# Patient Record
Sex: Male | Born: 1960 | Race: Black or African American | Hispanic: No | Marital: Married | Smoking: Former smoker
Health system: Southern US, Community
[De-identification: ages and names within clinical notes are randomized; demographics above are authoritative.]

## PROBLEM LIST (undated history)

## (undated) DIAGNOSIS — I10 Essential (primary) hypertension: Secondary | ICD-10-CM

## (undated) HISTORY — PX: SURGERY SCROTAL / TESTICULAR: SUR1316

## (undated) HISTORY — DX: Essential (primary) hypertension: I10

## (undated) HISTORY — PX: OTHER SURGICAL HISTORY: SHX169

---

## 2013-11-02 ENCOUNTER — Emergency Department: Payer: Self-pay | Admitting: Emergency Medicine

## 2013-11-02 LAB — COMPREHENSIVE METABOLIC PANEL
ANION GAP: 6 — AB (ref 7–16)
AST: 33 U/L (ref 15–37)
Albumin: 3.4 g/dL (ref 3.4–5.0)
Alkaline Phosphatase: 77 U/L
BILIRUBIN TOTAL: 0.5 mg/dL (ref 0.2–1.0)
BUN: 14 mg/dL (ref 7–18)
CALCIUM: 8.5 mg/dL (ref 8.5–10.1)
CO2: 26 mmol/L (ref 21–32)
Chloride: 111 mmol/L — ABNORMAL HIGH (ref 98–107)
Creatinine: 1.05 mg/dL (ref 0.60–1.30)
Glucose: 98 mg/dL (ref 65–99)
Osmolality: 285 (ref 275–301)
Potassium: 4 mmol/L (ref 3.5–5.1)
SGPT (ALT): 26 U/L
SODIUM: 143 mmol/L (ref 136–145)
TOTAL PROTEIN: 7.1 g/dL (ref 6.4–8.2)

## 2013-11-02 LAB — CBC WITH DIFFERENTIAL/PLATELET
Basophil #: 0.1 10*3/uL (ref 0.0–0.1)
Basophil %: 1.1 %
Eosinophil #: 0.1 10*3/uL (ref 0.0–0.7)
Eosinophil %: 2.4 %
HCT: 39.3 % — ABNORMAL LOW (ref 40.0–52.0)
HGB: 12.7 g/dL — ABNORMAL LOW (ref 13.0–18.0)
LYMPHS ABS: 2.2 10*3/uL (ref 1.0–3.6)
Lymphocyte %: 47.3 %
MCH: 30.4 pg (ref 26.0–34.0)
MCHC: 32.3 g/dL (ref 32.0–36.0)
MCV: 94 fL (ref 80–100)
MONO ABS: 0.4 x10 3/mm (ref 0.2–1.0)
Monocyte %: 9.6 %
Neutrophil #: 1.8 10*3/uL (ref 1.4–6.5)
Neutrophil %: 39.6 %
PLATELETS: 184 10*3/uL (ref 150–440)
RBC: 4.18 10*6/uL — AB (ref 4.40–5.90)
RDW: 12.6 % (ref 11.5–14.5)
WBC: 4.6 10*3/uL (ref 3.8–10.6)

## 2013-11-02 LAB — TROPONIN I

## 2015-03-16 ENCOUNTER — Encounter: Payer: Self-pay | Admitting: Primary Care

## 2015-03-16 ENCOUNTER — Ambulatory Visit (INDEPENDENT_AMBULATORY_CARE_PROVIDER_SITE_OTHER): Payer: BLUE CROSS/BLUE SHIELD | Admitting: Primary Care

## 2015-03-16 VITALS — BP 152/112 | HR 70 | Temp 97.5°F | Ht 73.0 in | Wt 254.8 lb

## 2015-03-16 DIAGNOSIS — I10 Essential (primary) hypertension: Secondary | ICD-10-CM | POA: Insufficient documentation

## 2015-03-16 MED ORDER — AMLODIPINE BESYLATE 10 MG PO TABS
10.0000 mg | ORAL_TABLET | Freq: Every day | ORAL | Status: DC
Start: 2015-03-16 — End: 2015-08-26

## 2015-03-16 NOTE — Assessment & Plan Note (Signed)
Diagnosed 12 years ago, out of his benicar for 4-5 months. Denies chest pain, headaches, dizziness. Will start Amlodipine 10 mg today. Follow up in 2 weeks for re-evaluation.

## 2015-03-16 NOTE — Progress Notes (Signed)
Pre visit review using our clinic review tool, if applicable. No additional management support is needed unless otherwise documented below in the visit note. 

## 2015-03-16 NOTE — Progress Notes (Signed)
   Subjective:    Patient ID: Wesley Ward, male    DOB: 1960-09-28, 55 y.o.   MRN: [id number]  HPI  Wesley Ward is a 55 year old male who presents today to establish care and discuss the problems mentioned below. Will review old records. His last physical was 5 months ago at a place in [place], but never completed blood work.   1) Essential Hypertension: Diagnosed 12 years ago. Previously managed on Benicar 20/12.5 mg but has not taken in 4-5 months. Denies chest pain, headaches, dizziness. He endorses a fair diet most of the time. FH of hypertension.   Review of Systems  Constitutional: Negative for unexpected weight change.  HENT: Negative for rhinorrhea.   Respiratory: Negative for cough and shortness of breath.   Cardiovascular: Negative for chest pain.  Gastrointestinal: Negative for diarrhea and constipation.  Genitourinary:       Last prostate exam was 1 year ago. Slower stream overall for the past 6 months.  Musculoskeletal: Negative for myalgias and arthralgias.  Skin: Negative for rash.  Allergic/Immunologic: Positive for environmental allergies.  Neurological: Negative for dizziness, numbness and headaches.  Psychiatric/Behavioral:       Denies concerns for anxiety or depression       Past Medical History  Diagnosis Date  . Essential hypertension     Social History   Social History  . Marital Status: Unknown    Spouse Name: N/A  . Number of Children: N/A  . Years of Education: N/A   Occupational History  . Not on file.   Social History Main Topics  . Smoking status: Never Smoker   . Smokeless tobacco: Not on file  . Alcohol Use: No  . Drug Use: Not on file  . Sexual Activity: Not on file   Other Topics Concern  . Not on file   Social History Narrative   Married.   4 children.   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   Enjoys doing comedy.     Past Surgical History  Procedure Laterality Date  . Other surgical history     testiuclar surgery    Family History  Problem Relation Age of Onset  . Cancer Father     Unsure  . Ovarian cancer Mother   . Congestive Heart Failure Brother   . Hypertension Brother     Allergies  Allergen Reactions  . Ace Inhibitors Swelling    No current outpatient prescriptions on file prior to visit.   No current facility-administered medications on file prior to visit.    BP 152/112 mmHg  Pulse 70  Temp(Src) 97.5 F (36.4 C) (Oral)  Ht 6\' 1"  (1.854 m)  Wt 254 lb 12.8 oz (115.577 kg)  BMI 33.62 kg/m2  SpO2 95%    Objective:   Physical Exam  Constitutional: He is oriented to person, place, and time. He appears well-nourished.  Cardiovascular: Normal rate and regular rhythm.   Pulmonary/Chest: Effort normal and breath sounds normal.  Neurological: He is alert and oriented to person, place, and time.  Skin: Skin is warm and dry.  Psychiatric: He has a normal mood and affect.          Assessment & Plan:

## 2015-03-16 NOTE — Patient Instructions (Signed)
Start Amlodipine tablets for high blood pressure. Take 1 tablet by mouth once daily.  Please schedule a physical with me within the next 3 months. You may also schedule a lab only appointment 3-4 days prior. We will discuss your lab results in detail during your physical.  Follow up in 2 weeks for re-evaluation of blood pressure.  It was a pleasure to meet you today! Please don't hesitate to call me with any questions. Welcome to [facility]!  Hypertension Hypertension, commonly called high blood pressure, is when the force of blood pumping through your arteries is too strong. Your arteries are the blood vessels that carry blood from your heart throughout your body. A blood pressure reading consists of a higher number over a lower number, such as 110/72. The higher number (systolic) is the pressure inside your arteries when your heart pumps. The lower number (diastolic) is the pressure inside your arteries when your heart relaxes. Ideally you want your blood pressure below 120/80. Hypertension forces your heart to work harder to pump blood. Your arteries may become narrow or stiff. Having untreated or uncontrolled hypertension can cause heart attack, stroke, kidney disease, and other problems. RISK FACTORS Some risk factors for high blood pressure are controllable. Others are not.  Risk factors you cannot control include:   Race. You may be at higher risk if you are African American.  Age. Risk increases with age.  Gender. Men are at higher risk than women before age 36 years. After age 70, women are at higher risk than men. Risk factors you can control include:  Not getting enough exercise or physical activity.  Being overweight.  Getting too much fat, sugar, calories, or salt in your diet.  Drinking too much alcohol. SIGNS AND SYMPTOMS Hypertension does not usually cause signs or symptoms. Extremely high blood pressure (hypertensive crisis) may cause headache, anxiety, shortness of  breath, and nosebleed. DIAGNOSIS To check if you have hypertension, your health care provider will measure your blood pressure while you are seated, with your arm held at the level of your heart. It should be measured at least twice using the same arm. Certain conditions can cause a difference in blood pressure between your right and left arms. A blood pressure reading that is higher than normal on one occasion does not mean that you need treatment. If it is not clear whether you have high blood pressure, you may be asked to return on a different day to have your blood pressure checked again. Or, you may be asked to monitor your blood pressure at home for 1 or more weeks. TREATMENT Treating high blood pressure includes making lifestyle changes and possibly taking medicine. Living a healthy lifestyle can help lower high blood pressure. You may need to change some of your habits. Lifestyle changes may include:  Following the DASH diet. This diet is high in fruits, vegetables, and whole grains. It is low in salt, red meat, and added sugars.  Keep your sodium intake below 2,300 mg per day.  Getting at least 30-45 minutes of aerobic exercise at least 4 times per week.  Losing weight if necessary.  Not smoking.  Limiting alcoholic beverages.  Learning ways to reduce stress. Your health care provider may prescribe medicine if lifestyle changes are not enough to get your blood pressure under control, and if one of the following is true:  You are 2-15 years of age and your systolic blood pressure is above 140.  You are 68 years of age or  older, and your systolic blood pressure is above 150.  Your diastolic blood pressure is above 90.  You have diabetes, and your systolic blood pressure is over 140 or your diastolic blood pressure is over 90.  You have kidney disease and your blood pressure is above 140/90.  You have heart disease and your blood pressure is above 140/90. Your personal target  blood pressure may vary depending on your medical conditions, your age, and other factors. HOME CARE INSTRUCTIONS  Have your blood pressure rechecked as directed by your health care provider.   Take medicines only as directed by your health care provider. Follow the directions carefully. Blood pressure medicines must be taken as prescribed. The medicine does not work as well when you skip doses. Skipping doses also puts you at risk for problems.  Do not smoke.   Monitor your blood pressure at home as directed by your health care provider. SEEK MEDICAL CARE IF:   You think you are having a reaction to medicines taken.  You have recurrent headaches or feel dizzy.  You have swelling in your ankles.  You have trouble with your vision. SEEK IMMEDIATE MEDICAL CARE IF:  You develop a severe headache or confusion.  You have unusual weakness, numbness, or feel faint.  You have severe chest or abdominal pain.  You vomit repeatedly.  You have trouble breathing. MAKE SURE YOU:   Understand these instructions.  Will watch your condition.  Will get help right away if you are not doing well or get worse.   This information is not intended to replace advice given to you by your health care provider. Make sure you discuss any questions you have with your health care provider.   Document Released: 02/27/2005 Document Revised: 07/14/2014 Document Reviewed: 12/20/2012 Elsevier Interactive Patient Education [employer]2016 Elsevier Inc.

## 2015-04-09 ENCOUNTER — Encounter: Payer: Self-pay | Admitting: Primary Care

## 2015-04-09 ENCOUNTER — Ambulatory Visit (INDEPENDENT_AMBULATORY_CARE_PROVIDER_SITE_OTHER): Payer: BLUE CROSS/BLUE SHIELD | Admitting: Primary Care

## 2015-04-09 VITALS — BP 160/92 | HR 76 | Temp 97.6°F | Ht 73.0 in | Wt 244.2 lb

## 2015-04-09 DIAGNOSIS — I10 Essential (primary) hypertension: Secondary | ICD-10-CM

## 2015-04-09 NOTE — Assessment & Plan Note (Signed)
Diastolic improved, overall remains above goal. Will have him record home readings and send them to me in 2 weeks. If no improvement with his efforts of weight loss, then will add second medication.

## 2015-04-09 NOTE — Patient Instructions (Signed)
Check your blood pressure on your days off, around the same time of day, for the next 2 weeks.  Ensure that you have rested for 30 minutes prior to checking your blood pressure. Record your readings and bring them to your next visit.  Please schedule a physical with me in 2 weeks. We will do same day labs. Make sure you come fasting for 4 hours. Water and black coffee only.  It was a pleasure to see you today!

## 2015-04-09 NOTE — Progress Notes (Signed)
Pre visit review using our clinic review tool, if applicable. No additional management support is needed unless otherwise documented below in the visit note. 

## 2015-04-09 NOTE — Progress Notes (Signed)
   Subjective:    Patient ID: Wesley Ward, male    DOB: 17-Nov-1960, 55 y.o.   MRN: [id number]  HPI  Mr. Cupples is a 55 year old male who presents today for follow up of hypertension. He was evaluated on 03/16/15 as a new patient and noted to be hypertensive. History of HTN for 12 years and was out of his benicar for several months. He was initiated on Amlodipine 10 mg last visit.   Since his last visit he's lost 10 pounds. He's been working to improve his diet and has been eating more salads. He's not checking his blood pressure as discussed. He did have some fatigue and decrease in appetite for 1 week after starting his medication, but denies those symptoms since 1 week ago. He currently denies chest pain, shortness of breath, dizziness, headaches.    Wt Readings from Last 3 Encounters:  04/09/15 244 lb 3.2 oz (110.768 kg)  03/16/15 254 lb 12.8 oz (115.577 kg)    BP Readings from Last 3 Encounters:  04/09/15 160/92  03/16/15 152/112      Review of Systems  Respiratory: Negative for shortness of breath.   Cardiovascular: Negative for chest pain and leg swelling.  Neurological: Negative for dizziness and headaches.       Past Medical History  Diagnosis Date  . Essential hypertension     Social History   Social History  . Marital Status: Unknown    Spouse Name: N/A  . Number of Children: N/A  . Years of Education: N/A   Occupational History  . Not on file.   Social History Main Topics  . Smoking status: Never Smoker   . Smokeless tobacco: Not on file  . Alcohol Use: No  . Drug Use: Not on file  . Sexual Activity: Not on file   Other Topics Concern  . Not on file   Social History Narrative   Married.   4 children.   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   Enjoys doing comedy.     Past Surgical History  Procedure Laterality Date  . Other surgical history      testiuclar surgery    Family History  Problem Relation Age of Onset    . Cancer Father     Unsure  . Ovarian cancer Mother   . Congestive Heart Failure Brother   . Hypertension Brother     Allergies  Allergen Reactions  . Ace Inhibitors Swelling    Current Outpatient Prescriptions on File Prior to Visit  Medication Sig Dispense Refill  . amLODipine (NORVASC) 10 MG tablet Take 1 tablet (10 mg total) by mouth daily. 30 tablet 3   No current facility-administered medications on file prior to visit.    BP 160/92 mmHg  Pulse 76  Temp(Src) 97.6 F (36.4 C) (Oral)  Ht  (1.854 m)  Wt 244 lb 3.2 oz (110.768 kg)  BMI 32.23 kg/m2  SpO2 95%    Objective:   Physical Exam  Constitutional: He appears well-nourished.  Cardiovascular: Normal rate and regular rhythm.   Pulmonary/Chest: Effort normal and breath sounds normal.  Skin: Skin KALEB SEKy.          Assessment & Plan:

## 2015-04-23 ENCOUNTER — Telehealth: Payer: Self-pay | Admitting: Primary Care

## 2015-04-23 NOTE — Telephone Encounter (Signed)
Will you call Wesley Ward to check on his BP readings. He was supposed to check his BP for the previous 2 weeks. Thanks.

## 2015-04-23 NOTE — Telephone Encounter (Signed)
Message left for patient to return my call.  

## 2015-04-26 ENCOUNTER — Telehealth: Payer: Self-pay | Admitting: Primary Care

## 2015-04-26 DIAGNOSIS — I10 Essential (primary) hypertension: Secondary | ICD-10-CM

## 2015-04-26 MED ORDER — HYDROCHLOROTHIAZIDE 25 MG PO TABS: 25.0000 mg | ORAL_TABLET | Freq: Every day | ORAL | Status: DC

## 2015-04-26 NOTE — Telephone Encounter (Signed)
Called and notified patient's spouse of Kate's comments. Patient's spouse verbalized understanding.  

## 2015-04-26 NOTE — Telephone Encounter (Signed)
Called and spoken to patient. He stated that he is doing ok. Blood Pressure running in the 160-170 over 90-80.

## 2015-04-26 NOTE — Telephone Encounter (Signed)
Please notify Wesley Ward that his blood pressure is still too high. We will need to add another BP medication to his regimen. I've sent in a prescription for Hydrochlorothiazide 25 mg to his pharmacy. He will take 1 tablet by mouth every day. Please have him to continue to check his BP daily, we will call back in 2 weeks for updated readings.  I need to see him back in the office in 1 month. Will you please schedule?

## 2015-05-14 ENCOUNTER — Ambulatory Visit: Payer: BLUE CROSS/BLUE SHIELD | Admitting: Primary Care

## 2015-05-28 ENCOUNTER — Encounter: Payer: Self-pay | Admitting: Primary Care

## 2015-05-28 ENCOUNTER — Ambulatory Visit (INDEPENDENT_AMBULATORY_CARE_PROVIDER_SITE_OTHER): Payer: BLUE CROSS/BLUE SHIELD | Admitting: Primary Care

## 2015-05-28 VITALS — BP 152/92 | HR 75 | Temp 97.5°F | Ht 73.0 in | Wt 237.0 lb

## 2015-05-28 DIAGNOSIS — R39198 Other difficulties with micturition: Secondary | ICD-10-CM | POA: Diagnosis not present

## 2015-05-28 DIAGNOSIS — I10 Essential (primary) hypertension: Secondary | ICD-10-CM

## 2015-05-28 MED ORDER — ATENOLOL 50 MG PO TABS: 50.0000 mg | ORAL_TABLET | Freq: Every day | ORAL | Status: DC

## 2015-05-28 MED ORDER — TAMSULOSIN HCL 0.4 MG PO CAPS: 0.4000 mg | ORAL_CAPSULE | Freq: Every day | ORAL | Status: DC

## 2015-05-28 NOTE — Progress Notes (Signed)
Subjective:    Patient ID: Wesley Ward, male    DOB: 1960-08-18, 55 y.o.   MRN: [id number]  HPI  Mr. Wesley Ward is a [age] year old male who presents today for follow up of hypertension. He was evaluated on 03/16/15 as a new patient and noted to be hypertensive. He was initiated on Amlodipine 10 mg during that visit. He was re-evaluated in late January and noted to have an improvement in blood pressure, but still over goal. He was encouraged to send home readings 2 weeks later. Home readings were 160's/90's 2 weeks later and was initiated on HCTZ 25 mg.  Since the addition of the HCTZ he's only been taking that medication as he was confused. He has not had his amlodipine in 1 month. His BP is above goal in the clinic today. Denies chest pain, headaches, dizziness, changes in vision. He has noticed increased cramping to his muscles over the past 2-3 weeks. He will get a cramp with any stretching or movement.   He's also working to improve his diet by making healthier choices (eating more salads, vegetables, less fried foods). He is not exercising.  Wt Readings from Last 3 Encounters:  05/28/15 237 lb (107.502 kg)  04/09/15 244 lb 3.2 oz (110.768 kg)  03/16/15 254 lb 12.8 oz (115.577 kg)    2) Difficulty Urinating: Present for the past 2 months. He will have to force his urine out. He feels a tightness/contraction to his bladder that prevents him from urinating. Denies nocturia, fevers, chills, dysuria. He's been drinking more water, less sugary drinks.   Review of Systems  Respiratory: Negative for shortness of breath.   Cardiovascular: Negative for chest pain.  Genitourinary: Positive for difficulty urinating. Negative for frequency, discharge and penile pain.  Musculoskeletal: Positive for myalgias.  Neurological: Negative for dizziness and headaches.       Past Medical History  Diagnosis Date  . Essential hypertension     Social History   Social History  . Marital Status:  Unknown    Spouse Name: N/A  . Number of Children: N/A  . Years of Education: N/A   Occupational History  . Not on file.   Social History Main Topics  . Smoking status: Never Smoker   . Smokeless tobacco: Not on file  . Alcohol Use: No  . Drug Use: Not on file  . Sexual Activity: Not on file   Other Topics Concern  . Not on file   Social History Narrative   Married.   4 children.   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   Enjoys doing comedy.     Past Surgical History  Procedure Laterality Date  . Other surgical history      testiuclar surgery    Family History  Problem Relation Age of Onset  . Cancer Father     Unsure  . Ovarian cancer Mother   . Congestive Heart Failure Brother   . Hypertension Brother     Allergies  Allergen Reactions  . Ace Inhibitors Swelling    Current Outpatient Prescriptions on File Prior to Visit  Medication Sig Dispense Refill  . amLODipine (NORVASC) 10 MG tablet Take 1 tablet (10 mg total) by mouth daily. 30 tablet 3   No current facility-administered medications on file prior to visit.    BP 152/92 mmHg  Pulse 75  Temp(Src) 97.5 F (36.4 C) (Oral)  Ht 6\' 1"  (1.854 m)  Wt 237 lb (107.502  kg)  BMI 31.27 kg/m2  SpO2 95%    Objective:   Physical Exam  Constitutional: He appears well-nourished.  Cardiovascular: Normal rate and regular rhythm.   Pulmonary/Chest: Effort normal and breath sounds normal.  Skin: Skin is warm and dry.          Assessment & Plan:

## 2015-05-28 NOTE — Assessment & Plan Note (Signed)
Above goal in clinic today. He's only taken the HCTZ as he thought he was to stop the Amlodipine. Also with wide spread muscle cramps since starting HCTZ. Will stop HCTZ and switch to Atenolol 50 mg. Allergy to ACE. BMP pending.  He is to send home readings in 2 weeks. Follow up in 1 month.

## 2015-05-28 NOTE — Addendum Note (Signed)
Addended by: Doreene NestLARK, Lauren Modisette K on: 05/28/2015 05:54 PM   Modules accepted: Kipp BroodSmartSet

## 2015-05-28 NOTE — Progress Notes (Signed)
Pre visit review using our clinic review tool, if applicable. No additional management support is needed unless otherwise documented below in the visit note. 

## 2015-05-28 NOTE — Assessment & Plan Note (Signed)
Present for the past 2-3 months. Sounds to be bladder neck spasm/tightness. Will check PSA today. No nocturia or fevers. Start trial of Flomax today. He is to update me in 2 weeks.

## 2015-05-28 NOTE — Patient Instructions (Addendum)
Start taking both the Atenolol 50 mg and Amlodipine 10 mg for blood pressure. Take 1 tablet of each by mouth every morning with food.  Stop taking Hydrochlorothiazide as it is likely causing muscle cramps.  You may start Tamsulosin (Flomax) tablets for difficulty urinating. Take 1 capsule by mouth once daily.  Check your blood pressure daily, around the same time of day, for the next 2 weeks.  Ensure that you have rested for 30 minutes prior to checking your blood pressure. Record your readings and call them into the office in 2 weeks.  Follow up as scheduled for re-evaluation of blood pressure.  It was a pleasure to see you today!

## 2015-05-29 LAB — BASIC METABOLIC PANEL
BUN: 14 mg/dL (ref 7–25)
CHLORIDE: 103 mmol/L (ref 98–110)
CO2: 26 mmol/L (ref 20–31)
CREATININE: 1.08 mg/dL (ref 0.70–1.33)
Calcium: 10 mg/dL (ref 8.6–10.3)
Glucose, Bld: 106 mg/dL — ABNORMAL HIGH (ref 65–99)
POTASSIUM: 4.1 mmol/L (ref 3.5–5.3)
Sodium: 138 mmol/L (ref 135–146)

## 2015-05-29 LAB — PSA: PSA: 1.26 ng/mL (ref <=4.00)

## 2015-06-02 ENCOUNTER — Encounter: Payer: Self-pay | Admitting: *Deleted

## 2015-06-03 ENCOUNTER — Encounter: Payer: Self-pay | Admitting: Emergency Medicine

## 2015-06-03 ENCOUNTER — Emergency Department
Admission: EM | Admit: 2015-06-03 | Discharge: 2015-06-03 | Disposition: A | Discharge status: Home / Self Care | Payer: BLUE CROSS/BLUE SHIELD | Attending: Emergency Medicine | Admitting: Emergency Medicine

## 2015-06-03 DIAGNOSIS — I10 Essential (primary) hypertension: Secondary | ICD-10-CM | POA: Insufficient documentation

## 2015-06-03 DIAGNOSIS — Z79899 Other long term (current) drug therapy: Secondary | ICD-10-CM | POA: Insufficient documentation

## 2015-06-03 LAB — CBC WITH DIFFERENTIAL/PLATELET
Basophils Absolute: 0.1 10*3/uL (ref 0–0.1)
Basophils Relative: 1 %
Eosinophils Absolute: 0.2 10*3/uL (ref 0–0.7)
Eosinophils Relative: 3 %
HCT: 37.2 % — ABNORMAL LOW (ref 40.0–52.0)
HEMOGLOBIN: 12.8 g/dL — AB (ref 13.0–18.0)
LYMPHS ABS: 3.4 10*3/uL (ref 1.0–3.6)
LYMPHS PCT: 45 %
MCH: 31 pg (ref 26.0–34.0)
MCHC: 34.4 g/dL (ref 32.0–36.0)
MCV: 90 fL (ref 80.0–100.0)
Monocytes Absolute: 0.6 10*3/uL (ref 0.2–1.0)
Monocytes Relative: 9 %
NEUTROS PCT: 42 %
Neutro Abs: 3.1 10*3/uL (ref 1.4–6.5)
Platelets: 224 10*3/uL (ref 150–440)
RBC: 4.14 MIL/uL — AB (ref 4.40–5.90)
RDW: 12.9 % (ref 11.5–14.5)
WBC: 7.4 10*3/uL (ref 3.8–10.6)

## 2015-06-03 LAB — BASIC METABOLIC PANEL
Anion gap: 6 (ref 5–15)
BUN: 13 mg/dL (ref 6–20)
CHLORIDE: 103 mmol/L (ref 101–111)
CO2: 27 mmol/L (ref 22–32)
Calcium: 9.2 mg/dL (ref 8.9–10.3)
Creatinine, Ser: 1.02 mg/dL (ref 0.61–1.24)
GFR calc Af Amer: >60 mL/min (ref >60)
GFR calc non Af Amer: >60 mL/min (ref >60)
GLUCOSE: 91 mg/dL (ref 65–99)
POTASSIUM: 3.6 mmol/L (ref 3.5–5.1)
Sodium: 136 mmol/L (ref 135–145)

## 2015-06-03 NOTE — ED Notes (Signed)
Patient had a recent change in his medications and was told to stop taking amlodipine and start taking atenolol. Patient states that this morning he took the amlodipine and tonight he took the atenolol around 20:00. Patient reports that on the ride here he became light headed.

## 2015-06-03 NOTE — ED Provider Notes (Signed)
[facility] Emergency Department Provider Note  ____________________________________________  Time seen: Approximately 10:08 PM  I have reviewed the triage vital signs and the nursing notes.   HISTORY  Chief Complaint Headache    HPI Wesley Ward is a 55 y.o. male with a history of hypertension who presents with concerns about his blood pressure.  His blood pressure medication regimen was recently changed around and he is confused about what is supposed be taking and what he is supposed to stop taking.  He showed me the instructions from his recent clinic visit which indicates he should be taking Norvasc and atenolol in the morning and he should discontinue his HCTZ, but he was confused because he states he was not previously taking HCTZ at all.  He came in because he was concerned his blood pressure was going to drop too low if he took the wrong medications.  He had a mild headache earlier but that has completely resolved.  He felt a little bit lightheaded earlier as well but that has also resolved he denies chest pain, shortness of breath, abdominal pain, nausea, vomiting, visual changes.  Overall he is essentially asymptomatic but mostly concerned about his blood pressure.   Past Medical History  Diagnosis Date  . Essential hypertension     Patient Active Problem List   Diagnosis Date Noted  . Difficulty urinating 05/28/2015  . Essential hypertension 03/16/2015    Past Surgical History  Procedure Laterality Date  . Other surgical history      testiuclar surgery    Current Outpatient Rx  Name  Route  Sig  Dispense  Refill  . amLODipine (NORVASC) 10 MG tablet   Oral   Take 1 tablet (10 mg total) by mouth daily.   30 tablet   3   . atenolol (TENORMIN) 50 MG tablet   Oral   Take 1 tablet (50 mg total) by mouth daily.   30 tablet   2   . tamsulosin (FLOMAX) 0.4 MG CAPS capsule   Oral   Take 1 capsule (0.4 mg total) by mouth daily.   30  capsule   0     Allergies Ace inhibitors  Family History  Problem Relation Age of Onset  . Cancer Father     Unsure  . Ovarian cancer Mother   . Congestive Heart Failure Brother   . Hypertension Brother     Social History Social History  Substance Use Topics  . Smoking status: Never Smoker   . Smokeless tobacco: None  . Alcohol Use: No    Review of Systems Constitutional: No fever/chills Eyes: No visual changes. ENT: No sore throat. Cardiovascular: Denies chest pain. Respiratory: Denies shortness of breath. Gastrointestinal: No abdominal pain.  No nausea, no vomiting.  No diarrhea.  No constipation. Genitourinary: Negative for dysuria. Musculoskeletal: Negative for back pain. Skin: Negative for rash. Neurological: Negative for headaches, focal weakness or numbness.  10-point ROS otherwise negative.  ____________________________________________   PHYSICAL EXAM:  VITAL SIGNS: ED Triage Vitals  Enc Vitals Group     BP 06/03/15 2052 172/106 mmHg     Pulse Rate 06/03/15 2052 64     Resp 06/03/15 2052 16     Temp 06/03/15 2052 97.8 F (36.6 C)     Temp Source 06/03/15 2052 Oral     SpO2 06/03/15 2052 94 %     Weight 06/03/15 2052 234 lb (106.142 kg)     Height 06/03/15 2052 6\' 2"  (1.88 m)  Head Cir --      Peak Flow --      Pain Score 06/03/15 2054 0     Pain Loc --      Pain Edu? --      Excl. in GC? --     Constitutional: Alert and oriented. Well appearing and in no acute distress. Eyes: Conjunctivae are normal. PERRL. Right sided strabismus. Head: Atraumatic. Nose: No congestion/rhinnorhea. Mouth/Throat: Mucous membranes are moist.  Oropharynx non-erythematous. Neck: No stridor.  No meningeal signs.   Cardiovascular: Normal rate, regular rhythm. Good peripheral circulation. Grossly normal heart sounds.   Respiratory: Normal respiratory effort.  No retractions. Lungs CTAB. Gastrointestinal: Soft and nontender. No distention.  Musculoskeletal: No  lower extremity tenderness nor edema. No gross deformities of extremities. Neurologic:  Normal speech and language. No gross focal neurologic deficits are appreciated.  Skin:  Skin is warm, dry and intact. No rash noted. Psychiatric: Mood and affect are normal. Speech and behavior are normal.  ____________________________________________   LABS (all labs ordered are listed, but only abnormal results are displayed)  Labs Reviewed  CBC WITH DIFFERENTIAL/PLATELET - Abnormal; Notable for the following:    RBC 4.14 (*)    Hemoglobin 12.8 (*)    HCT 37.2 (*)    All other components within normal limits  BASIC METABOLIC PANEL   ____________________________________________  EKG  None ____________________________________________  RADIOLOGY   No results found.  ____________________________________________   PROCEDURES  Procedure(s) performed: None  Critical Care performed: No ____________________________________________   INITIAL IMPRESSION / ASSESSMENT AND PLAN / ED COURSE  Pertinent labs & imaging results that were available during my care of the patient were reviewed by me and considered in my medical decision making (see chart for details).  I had my usual and customary benign hypertension discussion with the patient and encouraged him to continue taking his medication according to the most recent clinic visit notes.  His BMP is normal and there is no indication that he has any sort of an organ dysfunction.  Even though his blood pressure remained elevated in the emergency department he is on medication and recently was changed to a new regimen and he has the ability to follow closely as an outpatient.  There is no indication for further management in the emergency department at this time.  He and his wife understand and agree with this plan.  I gave my usual and customary return precautions.     ____________________________________________  FINAL CLINICAL IMPRESSION(S) /  ED DIAGNOSES  Final diagnoses:  Essential hypertension      NEW MEDICATIONS STARTED DURING THIS VISIT:  New Prescriptions   No medications on file      Note:  This document was prepared using Dragon voice recognition software and may include unintentional dictation errors.   Loleta Rose, MD 06/03/15 2229

## 2015-06-03 NOTE — ED Notes (Signed)
Unsure if taking blood pressure medication correctly.   Current medications have "different directions on them".  Patient seen by PCP on 3/17 and plan states to take amlodipine and atenolol daily and stop taking hydrochlorothiazide.  Patient c/o dull headache and occasional dizzines since Tuesday.  Today patient states he took amlodipine 10 mg this morning and this evening took 50 mg atenolol.

## 2015-06-03 NOTE — Discharge Instructions (Signed)

## 2015-06-04 ENCOUNTER — Ambulatory Visit: Payer: BLUE CROSS/BLUE SHIELD | Admitting: Primary Care

## 2015-06-04 ENCOUNTER — Telehealth: Payer: Self-pay

## 2015-06-04 NOTE — Telephone Encounter (Signed)
Pt is confused about his meds; pt said he has never taken HCTZ; pt states he did not know that pts wife spoke with anyone 04/2015 about starting HCTZ. On 06/03/15 pt took Atenolol 50 mg at 7:30 pm; on 06/04/15 at 4 AM pt took amlodipine 10 mg. Pt has not picked up tamsulosin yet because cost is $ 20.00. Pt has not taken BP today; usually takes at lunch break; 06/03/15 BP at lunch where pt had rested 30 mins prior to taking BP was 167/107. 06/03/15 at 10:15 pm BP was 148/96. Pt still had AVS from 05/28/15 visit and pt said he could read what it said but that was not what he was told at the visit. Pt request Mayra ReelKate Clark NP to review and contact pt.

## 2015-06-04 NOTE — Telephone Encounter (Signed)
Please have Wesley Ward take:  Atenolol 50 mg. Take 1 tablet by mouth every morning. This is for blood pressure. Amlodipine 10 mg. Take 1 tablet by mouth every morning. This is for blood pressure.  DO NOT TAKE: Hydrochlorothiazide 25 mg tablets as this caused muscle cramping.  All of this was written on his last AVS.

## 2015-06-04 NOTE — Telephone Encounter (Signed)
Patient advised and voiced understanding.  

## 2015-06-05 ENCOUNTER — Telehealth: Payer: Self-pay | Admitting: Primary Care

## 2015-06-05 NOTE — Telephone Encounter (Signed)
I see that Mr. Hissong presented to the ED for hypertension and confusion of medications. Will you please ensure he's aware of how to take his medications now? He should be taking Atenolol 50 mg once every day and Amlodipine 10 mg once every day. These medications are both for blood pressure.  It appears he was started on Amlodipine 10 mg in January 2017. We then added HCTZ 25 mg in February 2017 as his home readings were too high. Did he ever start this medication (HCTZ)?  He seemed to understand the plan during our last visit, so I'm just hoping things are cleared up now. Please also notify him to call us and ask to speak with me personally if he becomes confused again. Also please have him take note of home readings and call me in 2 weeks.  Thanks.

## 2015-06-07 NOTE — Telephone Encounter (Signed)
Noted. However he presented to the ED over the weekend with confusion regarding meds. Will continue to monitor.

## 2015-06-07 NOTE — Telephone Encounter (Signed)
There is a telephone note from 06/04/15 after ED visit where pt called in and you advised on how to take meds and to not take HCTZ--pt expressed understanding and will call back with home BP readings

## 2015-06-11 ENCOUNTER — Telehealth: Payer: Self-pay | Admitting: Primary Care

## 2015-06-11 NOTE — Telephone Encounter (Signed)
Called and notified patient of Kate's comments. Patient stated that he is taking his both his medication. Blood pressure readings have been around 176-150 over 94-92.

## 2015-06-11 NOTE — Telephone Encounter (Signed)
Noted. Hard to believe as it was this high last visit on one medication.  Will recheck in 1 week.

## 2015-06-11 NOTE — Telephone Encounter (Signed)
-----   Message from Doreene NestKatherine K Clark, NP sent at 06/05/2015  5:39 PM EDT ----- Regarding: BP Very confused on medication regimen.  Now on atenolol 50 mg and Amlodipine 10. Is he taking? How are BP readings?

## 2015-06-18 ENCOUNTER — Telehealth: Payer: Self-pay | Admitting: Primary Care

## 2015-06-18 NOTE — Telephone Encounter (Signed)
-----   Message from Doreene NestKatherine K Kahlel Peake, NP sent at 06/11/2015  5:19 PM EDT ----- Regarding: BP Will you please check on his blood pressure readings?

## 2015-06-18 NOTE — Telephone Encounter (Signed)
Message left for patient to return my call.  

## 2015-06-21 NOTE — Telephone Encounter (Signed)
Message left for patient to return my call.  

## 2015-07-02 ENCOUNTER — Encounter: Payer: Self-pay | Admitting: Primary Care

## 2015-07-02 ENCOUNTER — Ambulatory Visit (INDEPENDENT_AMBULATORY_CARE_PROVIDER_SITE_OTHER): Payer: BLUE CROSS/BLUE SHIELD | Admitting: Primary Care

## 2015-07-02 VITALS — BP 138/78 | HR 92 | Temp 97.8°F | Ht 74.0 in | Wt 239.4 lb

## 2015-07-02 DIAGNOSIS — Z0000 Encounter for general adult medical examination without abnormal findings: Secondary | ICD-10-CM | POA: Diagnosis not present

## 2015-07-02 DIAGNOSIS — Z1211 Encounter for screening for malignant neoplasm of colon: Secondary | ICD-10-CM | POA: Diagnosis not present

## 2015-07-02 DIAGNOSIS — I10 Essential (primary) hypertension: Secondary | ICD-10-CM

## 2015-07-02 DIAGNOSIS — R39198 Other difficulties with micturition: Secondary | ICD-10-CM

## 2015-07-02 NOTE — Patient Instructions (Signed)
You will be contacted regarding your referral to GI for your colonoscopy.  Please let us know if you have not heard back within one week.   Start exercising. You should be getting 1 hour of moderate intensity exercise 5 days weekly.  Work to [employer]. Ensure you are eating enough fresh fruits, vegetables, lean meats.  Follow up in 6 months for re-evaluation of your blood pressure.  It was a pleasure to see you today!  DASH Eating Plan DASH stands for "Dietary Approaches to Stop Hypertension." The DASH eating plan is a healthy eating plan that has been shown to reduce high blood pressure (hypertension). Additional health benefits may include reducing the risk of type 2 diabetes mellitus, heart disease, and stroke. The DASH eating plan may also help with weight loss. WHAT DO I NEED TO KNOW ABOUT THE DASH EATING PLAN? For the DASH eating plan, you will follow these general guidelines:  Choose foods with a percent daily value for sodium of less than 5% (as listed on the food label).  Use salt-free seasonings or herbs instead of table salt or sea salt.  Check with your health care provider or pharmacist before using salt substitutes.  Eat lower-sodium products, often labeled as "lower sodium" or "no salt added."  Eat fresh foods.  Eat more vegetables, fruits, and low-fat dairy products.  Choose whole grains. Look for the word "whole" as the first word in the ingredient list.  Choose fish and skinless chicken or [place] more often than red meat. Limit fish, poultry, and meat to 6 oz (170 g) each day.  Limit sweets, desserts, sugars, and sugary drinks.  Choose heart-healthy fats.  Limit cheese to 1 oz (28 g) per day.  Eat more home-cooked food and less restaurant, buffet, and fast food.  Limit fried foods.  Cook foods using methods other than frying.  Limit canned vegetables. If you do use them, rinse them well to decrease the sodium.  When eating at a restaurant, ask  that your food be prepared with less salt, or no salt if possible. WHAT FOODS CAN I EAT? Seek help from a dietitian for individual calorie needs. Grains Whole grain or whole wheat bread. Brown rice. Whole grain or whole wheat pasta. Quinoa, bulgur, and whole grain cereals. Low-sodium cereals. Corn or whole wheat flour tortillas. Whole grain cornbread. Whole grain crackers. Low-sodium crackers. Vegetables Fresh or frozen vegetables (raw, steamed, roasted, or grilled). Low-sodium or reduced-sodium tomato and vegetable juices. Low-sodium or reduced-sodium tomato sauce and paste. Low-sodium or reduced-sodium canned vegetables.  Fruits All fresh, canned (in natural juice), or frozen fruits. Meat and Other Protein Products Ground beef (85% or leaner), grass-fed beef, or beef trimmed of fat. Skinless chicken or [place]. Ground chicken or [place]. Pork trimmed of fat. All fish and seafood. Eggs. Dried beans, peas, or lentils. Unsalted nuts and seeds. Unsalted canned beans. Dairy Low-fat dairy products, such as skim or 1% milk, 2% or reduced-fat cheeses, low-fat ricotta or cottage cheese, or plain low-fat yogurt. Low-sodium or reduced-sodium cheeses. Fats and Oils Tub margarines without trans fats. Light or reduced-fat mayonnaise and salad dressings (reduced sodium). Avocado. Safflower, olive, or canola oils. Natural peanut or almond butter. Other Unsalted popcorn and pretzels. The items listed above may not be a complete list of recommended foods or beverages. Contact your dietitian for more options. WHAT FOODS ARE NOT RECOMMENDED? Grains White bread. White pasta. White rice. Refined cornbread. Bagels and croissants. Crackers that contain trans fat. Vegetables Creamed or  fried vegetables. Vegetables in a cheese sauce. Regular canned vegetables. Regular canned tomato sauce and paste. Regular tomato and vegetable juices. Fruits Dried fruits. Canned fruit in light or heavy syrup. Fruit juice. Meat and  Other Protein Products Fatty cuts of meat. Ribs, chicken wings, bacon, sausage, bologna, salami, chitterlings, fatback, hot dogs, bratwurst, and packaged luncheon meats. Salted nuts and seeds. Canned beans with salt. Dairy Whole or 2% milk, cream, half-and-half, and cream cheese. Whole-fat or sweetened yogurt. Full-fat cheeses or blue cheese. Nondairy creamers and whipped toppings. Processed cheese, cheese spreads, or cheese curds. Condiments Onion and garlic salt, seasoned salt, table salt, and sea salt. Canned and packaged gravies. Worcestershire sauce. Tartar sauce. Barbecue sauce. Teriyaki sauce. Soy sauce, including reduced sodium. Steak sauce. Fish sauce. Oyster sauce. Cocktail sauce. Horseradish. Ketchup and mustard. Meat flavorings and tenderizers. Bouillon cubes. Hot sauce. Tabasco sauce. Marinades. Taco seasonings. Relishes. Fats and Oils Butter, stick margarine, lard, shortening, ghee, and bacon fat. Coconut, palm kernel, or palm oils. Regular salad dressings. Other Pickles and olives. Salted popcorn and pretzels. The items listed above may not be a complete list of foods and beverages to avoid. Contact your dietitian for more information. WHERE CAN I FIND MORE INFORMATION? National Heart, Lung, and Blood Institute: CableProm[employer].itwww.nhlbi.nih.gov/health/health-topics/topics/dash/   This information is not intended to replace advice given to you by your health care provider. Make sure you discuss any questions you have with your health care provider.   Document Released: 02/16/2011 Document Revised: 03/20/2014 Document Reviewed: 01/01/2013 Elsevier Interactive Patient Education [employer]2016 Elsevier Inc.

## 2015-07-02 NOTE — Progress Notes (Signed)
Subjective:    Patient ID: Wesley Ward, male    DOB: Feb 24, 1961, 55 y.o.   MRN: [id number]  HPI  Wesley Ward is a 55 year old male who presents today for complete physical.  Immunizations: -Tetanus: Unsure, believes it's been within 10 years. -Influenza: Did not receive last season.    Diet: He endorses a fair diet. Breakfast: Skips, milk, orange juice Lunch: Skips Dinner: Meat, vegetables, salads Snacks: Crackers Desserts: Ice cream, occasionally Beverages: Milk, juice, water  Exercise: He currently does not exercise. He is active at work. Occasionally lifts weights at home. Eye exam: Completed 2 years ago, due.  Dental exam: Completed 2 years ago.  Colonoscopy: Never completed, due.    Review of Systems  Constitutional: Negative for unexpected weight change.  HENT: Negative for rhinorrhea.   Respiratory: Negative for cough and shortness of breath.   Cardiovascular: Negative for chest pain.  Gastrointestinal: Negative for diarrhea and constipation.  Genitourinary:       Improved with Flomax, improvement in urinary flow. Denies nocturia.  Musculoskeletal: Negative for myalgias and arthralgias.  Skin: Negative for rash.  Allergic/Immunologic: Positive for environmental allergies.  Neurological: Negative for dizziness and headaches.  Psychiatric/Behavioral:       Denies concerns for anxiety or depression       Past Medical History  Diagnosis Date  . Essential hypertension      Social History   Social History  . Marital Status: Unknown    Spouse Name: N/A  . Number of Children: N/A  . Years of Education: N/A   Occupational History  . Not on file.   Social History Main Topics  . Smoking status: Never Smoker   . Smokeless tobacco: Not on file  . Alcohol Use: No  . Drug Use: Not on file  . Sexual Activity: Not on file   Other Topics Concern  . Not on file   Social History Narrative   Married.   4 children.   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   Enjoys doing comedy.     Past Surgical History  Procedure Laterality Date  . Other surgical history      testiuclar surgery    Family History  Problem Relation Age of Onset  . Cancer Father     Unsure  . Ovarian cancer Mother   . Congestive Heart Failure Brother   . Hypertension Brother     Allergies  Allergen Reactions  . Ace Inhibitors Swelling    Current Outpatient Prescriptions on File Prior to Visit  Medication Sig Dispense Refill  . amLODipine (NORVASC) 10 MG tablet Take 1 tablet (10 mg total) by mouth daily. 30 tablet 3  . atenolol (TENORMIN) 50 MG tablet Take 1 tablet (50 mg total) by mouth daily. 30 tablet 2  . tamsulosin (FLOMAX) 0.4 MG CAPS capsule Take 1 capsule (0.4 mg total) by mouth daily. 30 capsule 0   No current facility-administered medications on file prior to visit.    BP 138/78 mmHg  Pulse 92  Temp(Src) 97.8 F (36.6 C) (Oral)  Ht 6\' 2"  (1.88 m)  Wt 239 lb 6.4 oz (108.591 kg)  BMI 30.72 kg/m2  SpO2 96%    Objective:   Physical Exam  Constitutional: He is oriented to person, place, and time. He appears well-nourished.  HENT:  Right Ear: Tympanic membrane and ear canal normal.  Left Ear: Tympanic membrane and ear canal normal.  Nose: Nose normal. Right sinus  exhibits no maxillary sinus tenderness and no frontal sinus tenderness. Left sinus exhibits no maxillary sinus tenderness and no frontal sinus tenderness.  Mouth/Throat: Oropharynx is clear and moist.  Eyes: Conjunctivae and EOM are normal. Pupils are equal, round, and reactive to light.  Neck: Neck supple. Carotid bruit is not present. No thyromegaly present.  Cardiovascular: Normal rate, regular rhythm and normal heart sounds.   Pulmonary/Chest: Effort normal and breath sounds normal. He has no wheezes. He has no rales.  Abdominal: Soft. Bowel sounds are normal. There is no tenderness.  Musculoskeletal: Normal range of motion.  Neurological: He is alert and  oriented to person, place, and time. He has normal reflexes. No cranial nerve deficit.  Skin: Skin is warm and dry.  Psychiatric: He has a normal mood and affect.          Assessment & Plan:

## 2015-07-02 NOTE — Progress Notes (Signed)
Pre visit review using our clinic review tool, if applicable. No additional management support is needed unless otherwise documented below in the visit note. 

## 2015-07-03 LAB — COMPREHENSIVE METABOLIC PANEL
ALK PHOS: 69 U/L (ref 40–115)
ALT: 15 U/L (ref 9–46)
AST: 16 U/L (ref 10–35)
Albumin: 4.3 g/dL (ref 3.6–5.1)
BILIRUBIN TOTAL: 0.7 mg/dL (ref 0.2–1.2)
BUN: 14 mg/dL (ref 7–25)
CALCIUM: 9.3 mg/dL (ref 8.6–10.3)
CO2: 25 mmol/L (ref 20–31)
CREATININE: 1.05 mg/dL (ref 0.70–1.33)
Chloride: 105 mmol/L (ref 98–110)
GLUCOSE: 84 mg/dL (ref 65–99)
Potassium: 4.4 mmol/L (ref 3.5–5.3)
SODIUM: 139 mmol/L (ref 135–146)
Total Protein: 7.2 g/dL (ref 6.1–8.1)

## 2015-07-03 LAB — LIPID PANEL
CHOL/HDL RATIO: 4.8 ratio (ref <=5.0)
Cholesterol: 198 mg/dL (ref 125–200)
HDL: 41 mg/dL (ref >=40)
LDL CALC: 125 mg/dL (ref <130)
Triglycerides: 158 mg/dL — ABNORMAL HIGH (ref <150)
VLDL: 32 mg/dL — ABNORMAL HIGH (ref <30)

## 2015-07-03 LAB — HEMOGLOBIN A1C
Hgb A1c MFr Bld: 5.3 % (ref <5.7)
Mean Plasma Glucose: 105 mg/dL

## 2015-07-04 DIAGNOSIS — Z0000 Encounter for general adult medical examination without abnormal findings: Secondary | ICD-10-CM | POA: Insufficient documentation

## 2015-07-04 NOTE — Assessment & Plan Note (Signed)
Tdap UTD per patient. Referral placed for colonoscopy. Exam unremarkable. Labs pending. Discussed the importance of a healthy diet and regular exercise in order to reduce risk of other medical diseases.  Follow up in 1 year for repeat physical.

## 2015-07-04 NOTE — Assessment & Plan Note (Signed)
Improved and stable on Amlodipine 10 mg and Atenolol 50 mg. HR stable. CMP pending, continue current regimen.

## 2015-07-04 NOTE — Assessment & Plan Note (Signed)
Improved with Flomax, continue for now.

## 2015-07-06 ENCOUNTER — Encounter: Payer: Self-pay | Admitting: *Deleted

## 2015-07-28 ENCOUNTER — Other Ambulatory Visit: Payer: Self-pay | Admitting: Primary Care

## 2015-07-28 NOTE — Telephone Encounter (Signed)
Electronically refill request for   tamsulosin (FLOMAX) 0.4 MG CAPS capsule   Take 1 capsule (0.4 mg total) by mouth daily.  Dispense: 30 capsule   Refills: 0     Last prescribed on 05/28/2015. Last seen on 07/02/2015. No future appt.

## 2015-08-26 ENCOUNTER — Other Ambulatory Visit: Payer: Self-pay | Admitting: Primary Care

## 2015-10-18 ENCOUNTER — Other Ambulatory Visit: Payer: Self-pay | Admitting: Primary Care

## 2015-10-18 DIAGNOSIS — I10 Essential (primary) hypertension: Secondary | ICD-10-CM

## 2015-12-24 ENCOUNTER — Encounter: Payer: Self-pay | Admitting: Emergency Medicine

## 2015-12-24 ENCOUNTER — Emergency Department: Payer: BLUE CROSS/BLUE SHIELD

## 2015-12-24 ENCOUNTER — Emergency Department
Admission: EM | Admit: 2015-12-24 | Discharge: 2015-12-24 | Disposition: A | Discharge status: Home / Self Care | Payer: BLUE CROSS/BLUE SHIELD | Attending: Emergency Medicine | Admitting: Emergency Medicine

## 2015-12-24 DIAGNOSIS — I10 Essential (primary) hypertension: Secondary | ICD-10-CM | POA: Insufficient documentation

## 2015-12-24 DIAGNOSIS — F17200 Nicotine dependence, unspecified, uncomplicated: Secondary | ICD-10-CM | POA: Insufficient documentation

## 2015-12-24 DIAGNOSIS — Y939 Activity, unspecified: Secondary | ICD-10-CM | POA: Insufficient documentation

## 2015-12-24 DIAGNOSIS — M7022 Olecranon bursitis, left elbow: Secondary | ICD-10-CM | POA: Insufficient documentation

## 2015-12-24 DIAGNOSIS — Z79899 Other long term (current) drug therapy: Secondary | ICD-10-CM | POA: Insufficient documentation

## 2015-12-24 MED ORDER — NAPROXEN 500 MG PO TABS: 500.0000 mg | ORAL_TABLET | Freq: Two times a day (BID) | ORAL | 0 refills | Status: DC

## 2015-12-24 NOTE — ED Triage Notes (Signed)
Patient reports that he fell on his left elbow 3 weeks ago. Patient states that his elbow started swelling tonight.

## 2015-12-24 NOTE — ED Provider Notes (Signed)
[facility] Emergency Department Provider Note  ____________________________________________  Time seen: Approximately 8:08 PM  I have reviewed the triage vital signs and the nursing notes.   HISTORY  Chief Complaint Elbow Pain    HPI Wesley Ward is a 55 y.o. male, NAD, presents to the emergency department with 30 minute history of left elbow swelling. Patient states he fell onto his left elbow approximately 3-4 weeks ago. Had some mild pain at that time but no swelling, bruising or lacerations. Approximately half an hour before arrival to the emergency department he noted significant swelling onset about the posterior left elbow. Denies any pain but states it feels "tight". No other injuries or traumas to the elbow. Denies any redness, abnormal warmth, skin sores, oozing or weeping. Denies fevers, chills. Has had no numbness, weakness or tingling of the upper extremity.   Past Medical History:  Diagnosis Date  . Essential hypertension     Patient Active Problem List   Diagnosis Date Noted  . Preventative health care 07/04/2015  . Difficulty urinating 05/28/2015  . Essential hypertension 03/16/2015    Past Surgical History:  Procedure Laterality Date  . OTHER SURGICAL HISTORY     testiuclar surgery    Prior to Admission medications   Medication Sig Start Date End Date Taking? Authorizing Provider  amLODipine (NORVASC) 10 MG tablet TAKE 1 TABLET BY MOUTH ONCE A DAY 08/26/15   Doreene NestKatherine K Clark, NP  atenolol (TENORMIN) 50 MG tablet TAKE 1 TABLET BY MOUTH ONCE A DAY 10/18/15   Doreene NestKatherine K Clark, NP  naproxen (NAPROSYN) 500 MG tablet Take 1 tablet (500 mg total) by mouth 2 (two) times daily with a meal. 12/24/15   Laportia Carley L Ellis Mehaffey, PA-C  tamsulosin (FLOMAX) 0.4 MG CAPS capsule TAKE 1 CAPSULE BY MOUTH ONCE DAILY 07/29/15   Doreene NestKatherine K Clark, NP    Allergies Ace inhibitors  Family History  Problem Relation Age of Onset  . Cancer Father     Unsure  .  Ovarian cancer Mother   . Congestive Heart Failure Brother   . Hypertension Brother     Social History Social History  Substance Use Topics  . Smoking status: Current Some Day Smoker  . Smokeless tobacco: Never Used  . Alcohol use No     Review of Systems  Constitutional: No fever/chills Musculoskeletal: Positive left elbow pain and swelling.  Skin: Negative for rash, redness, abnormal warmth, skin sores, oozing, weeping. Neurological: Negative for numbness, weakness, tingling. 10-point ROS otherwise negative.  ____________________________________________   PHYSICAL EXAM:  VITAL SIGNS: ED Triage Vitals  Enc Vitals Group     BP 12/24/15 1931 (!) 161/105     Pulse Rate 12/24/15 1931 64     Resp 12/24/15 1931 18     Temp 12/24/15 1931 97.8 F (36.6 C)     Temp Source 12/24/15 1931 Oral     SpO2 12/24/15 1931 93 %     Weight 12/24/15 1932 236 lb (107 kg)     Height 12/24/15 1932 6\' 2"  (1.88 m)     Head Circumference --      Peak Flow --      Pain Score 12/24/15 1933 2     Pain Loc --      Pain Edu? --      Excl. in GC? --      Constitutional: Alert and oriented. Well appearing and in no acute distress. Eyes: Conjunctivae are normal.   Head: Atraumatic. Neck:  Supple  with full range of motion Cardiovascular: Good peripheral circulation with 2+ pulses noted in the left upper extremity. Respiratory: Normal respiratory effort without tachypnea or retractions.  Musculoskeletal: Moderate swelling is noted about the left olecranon consistent with bursal swelling. No tenderness to palpation of the area. No erythema, abnormal warmth, skin sores. Range of motion of all joints of the left upper extremity is noted. Grip strength is 5 out of 5 in bilateral hands. Neurologic:  Normal speech and language. No gross focal neurologic deficits are appreciated.  Skin:  Skin is warm, dry and intact. No rash noted. Psychiatric: Mood and affect are normal. Speech and behavior are normal.  Patient exhibits appropriate insight and judgement.   ____________________________________________   LABS  None ____________________________________________  EKG  None ____________________________________________  RADIOLOGY I, Hope Pigeon, personally viewed and evaluated these images (plain radiographs) as part of my medical decision making, as well as reviewing the written report by the radiologist.  Dg Elbow Complete Left  Result Date: 12/24/2015 CLINICAL DATA:  Initial evaluation for acute elbow pain after falling 1 month ago. EXAM: LEFT ELBOW - COMPLETE 3+ VIEW COMPARISON:  None. FINDINGS: No acute fracture dislocation. No joint effusion. Radial head intact. Degenerative spurring present at the olecranon. Prominent posterior soft tissue swelling present at the elbow, may be posttraumatic or possibly reflect acute bursitis. Osseous mineralization normal. IMPRESSION: 1. No acute osseous abnormality about the elbow. 2. Prominent soft tissue swelling at the posterior aspect of the elbow. While this finding could be posttraumatic in nature, possible olecranon bursitis could also have this appearance. 3. Degenerative spurring at the olecranon. Electronically Signed   By: Rise Mu M.D.   On: 12/24/2015 20:08    ____________________________________________    PROCEDURES  Procedure(s) performed: None   Procedures   Medications - No data to display   ____________________________________________   INITIAL IMPRESSION / ASSESSMENT AND PLAN / ED COURSE  Pertinent labs & imaging results that were available during my care of the patient were reviewed by me and considered in my medical decision making (see chart for details).  Clinical Course    Patient's diagnosis is consistent with Left olecranon bursitis. Patient was placed in an Ace wrap for compression and comfort care about the left elbow. Patient will be discharged home with prescriptions for naproxen to take  as directed. Patient is to follow up with Dr. Joice Lofts in orthopedics if symptoms persist past this treatment course. Patient is given ED precautions to return to the ED for any worsening or new symptoms.   ____________________________________________  FINAL CLINICAL IMPRESSION(S) / ED DIAGNOSES  Final diagnoses:  Olecranon bursitis of left elbow      NEW MEDICATIONS STARTED DURING THIS VISIT:  Discharge Medication List as of 12/24/2015  8:26 PM    START taking these medications   Details  naproxen (NAPROSYN) 500 MG tablet Take 1 tablet (500 mg total) by mouth 2 (two) times daily with a meal., Starting Fri 12/24/2015, Print             Ernestene Kiel [place], PA-C 12/24/15 2116    Jennye Moccasin, MD 12/24/15 2231

## 2016-03-16 ENCOUNTER — Ambulatory Visit (INDEPENDENT_AMBULATORY_CARE_PROVIDER_SITE_OTHER): Payer: Self-pay | Admitting: Primary Care

## 2016-03-16 DIAGNOSIS — Z0289 Encounter for other administrative examinations: Secondary | ICD-10-CM

## 2016-04-28 ENCOUNTER — Ambulatory Visit: Payer: Self-pay | Admitting: Primary Care

## 2016-06-14 ENCOUNTER — Other Ambulatory Visit: Payer: Self-pay | Admitting: Primary Care

## 2016-06-14 DIAGNOSIS — I10 Essential (primary) hypertension: Secondary | ICD-10-CM

## 2016-06-15 NOTE — Telephone Encounter (Signed)
Pt requesting refill of amlodipine. Last OV 07-02-15 No Showed 03-16-16 and 04-28-16. What would you like to do?

## 2016-06-16 NOTE — Telephone Encounter (Signed)
Message left for patient to return my call.  

## 2016-06-19 ENCOUNTER — Encounter: Payer: Self-pay | Admitting: *Deleted

## 2016-06-19 NOTE — Telephone Encounter (Signed)
Lm on pts vm requesting a call back to schedule OV. Letter mailed as requested by Jae Dire

## 2016-08-24 ENCOUNTER — Encounter: Payer: Self-pay | Admitting: Emergency Medicine

## 2016-08-24 ENCOUNTER — Emergency Department
Admission: EM | Admit: 2016-08-24 | Discharge: 2016-08-24 | Disposition: A | Discharge status: Home / Self Care | Payer: Self-pay | Attending: Emergency Medicine | Admitting: Emergency Medicine

## 2016-08-24 DIAGNOSIS — I10 Essential (primary) hypertension: Secondary | ICD-10-CM | POA: Insufficient documentation

## 2016-08-24 DIAGNOSIS — Z79899 Other long term (current) drug therapy: Secondary | ICD-10-CM | POA: Insufficient documentation

## 2016-08-24 DIAGNOSIS — F17200 Nicotine dependence, unspecified, uncomplicated: Secondary | ICD-10-CM | POA: Insufficient documentation

## 2016-08-24 DIAGNOSIS — Z791 Long term (current) use of non-steroidal anti-inflammatories (NSAID): Secondary | ICD-10-CM | POA: Insufficient documentation

## 2016-08-24 DIAGNOSIS — Z760 Encounter for issue of repeat prescription: Secondary | ICD-10-CM | POA: Insufficient documentation

## 2016-08-24 MED ORDER — AMLODIPINE BESYLATE 10 MG PO TABS: 10.0000 mg | ORAL_TABLET | Freq: Every day | ORAL | 0 refills | Status: DC

## 2016-08-24 NOTE — Discharge Instructions (Signed)
Get medication filled today and start taking your blood pressure medication daily. Keep your appointment with her primary care provider in July.

## 2016-08-24 NOTE — ED Notes (Signed)
See triage note  States he is has been out of b/p meds for about 1 month  Denies any other sx's

## 2016-08-24 NOTE — ED Provider Notes (Signed)
[facility] Emergency Department Provider Note ____________________________________________  Time seen: 11:42 AM  I have reviewed the triage vital signs and the nursing notes.  HISTORY  Chief Complaint  Medication Refill   HPI Wesley Ward is a 56 y.o. male vesicular refill of his medication. Patient states that he has been taking Norvasc 10 mg one daily for his blood pressure but has been out for over a month. He states he is unable to get his medication because of problems with his insurance. He states that he has an appointment with his PCP July 17. He denies any chest pain, shortness of breath, dizziness or blurred vision. He denies any pain.    Past Medical History:  Diagnosis Date  . Essential hypertension     Patient Active Problem List   Diagnosis Date Noted  . Preventative health care 07/04/2015  . Difficulty urinating 05/28/2015  . Essential hypertension 03/16/2015    Past Surgical History:  Procedure Laterality Date  . OTHER SURGICAL HISTORY     testiuclar surgery  . SURGERY SCROTAL / TESTICULAR      Prior to Admission medications   Medication Sig Start Date End Date Taking? Authorizing Provider  amLODipine (NORVASC) 10 MG tablet Take 1 tablet (10 mg total) by mouth daily. 08/24/16   Tommi Rumps, PA-C  atenolol (TENORMIN) 50 MG tablet TAKE 1 TABLET BY MOUTH ONCE A DAY 10/18/15   Doreene Nest, NP  naproxen (NAPROSYN) 500 MG tablet Take 1 tablet (500 mg total) by mouth 2 (two) times daily with a meal. 12/24/15   Hagler, Jami L, PA-C  tamsulosin (FLOMAX) 0.4 MG CAPS capsule TAKE 1 CAPSULE BY MOUTH ONCE DAILY 07/29/15   Doreene Nest, NP    Allergies Ace inhibitors  Family History  Problem Relation Age of Onset  . Cancer Father        Unsure  . Ovarian cancer Mother   . Congestive Heart Failure Brother   . Hypertension Brother     Social History Social History  Substance Use Topics  . Smoking status: Current  Some Day Smoker  . Smokeless tobacco: Never Used  . Alcohol use No    Review of Systems  Constitutional: Negative for fever. Eyes: Negative for visual changes. Cardiovascular: Negative for chest pain. Respiratory: Negative for shortness of breath. Skin: Negative for rash. Neurological: Negative for headaches, focal weakness or numbness. ____________________________________________  PHYSICAL EXAM:  VITAL SIGNS: ED Triage Vitals  Enc Vitals Group     BP 08/24/16 1113 (!) 162/103     Pulse Rate 08/24/16 1113 72     Resp 08/24/16 1113 18     Temp 08/24/16 1113 98 F (36.7 C)     Temp Source 08/24/16 1113 Oral     SpO2 08/24/16 1113 95 %     Weight 08/24/16 1114 233 lb (105.7 kg)     Height 08/24/16 1114 6\' 2"  (1.88 m)     Head Circumference --      Peak Flow --      Pain Score 08/24/16 1113 5     Pain Loc --      Pain Edu? --      Excl. in GC? --     Constitutional: Alert and oriented. Well appearing and in no distress. Head: Normocephalic and atraumatic. Neck: No stridor Cardiovascular: Normal rate, regular rhythm. Normal distal pulses. Respiratory: Normal respiratory effort. No wheezes/rales/rhonchi. Musculoskeletal: Nontender with normal range of motion in all extremities.  Neurologic:  Normal gait without ataxia. Normal speech and language. No gross focal neurologic deficits are appreciated. Skin:  Skin is warm, dry and intact. No rash noted. Psychiatric: Mood and affect are normal. Patient exhibits appropriate insight and judgment. ____________________________________________ INITIAL IMPRESSION / ASSESSMENT AND PLAN / ED COURSE  Patient was given a prescription for Norvasc 10 mg #30. Patient is encouraged to keep his appointment with his PCP on July 17. He was also given a coupon from [facility] showing him that the cheapest place in [place] today is [facility].    ____________________________________________  FINAL CLINICAL IMPRESSION(S) / ED DIAGNOSES  Final  diagnoses:  Encounter for medication refill  Essential hypertension     Eather ColasSummers, Rhonda L, PA-C 08/24/16 1246    Emily FilbertWilliams, Jonathan E, MD 08/24/16 1256

## 2016-08-24 NOTE — ED Triage Notes (Signed)
Pt comes into the ED via POV c/o medication refill of his blood pressure medication, Norvasc.   Patient states he has been out of his BP medication for 1 month but states he hasn't been able to get his insurance approved so he has been unable to have the medication filled.  Patient denies any chest pain, shortness of breath, dizziness, blurred vision.  Patient ambulatory to triage and has even and unlabored respirations.

## 2016-10-17 ENCOUNTER — Emergency Department
Admission: EM | Admit: 2016-10-17 | Discharge: 2016-10-18 | Disposition: A | Discharge status: Home / Self Care | Payer: Self-pay | Attending: Student in an Organized Health Care Education/Training Program | Admitting: Student in an Organized Health Care Education/Training Program

## 2016-10-17 ENCOUNTER — Emergency Department: Payer: Self-pay

## 2016-10-17 ENCOUNTER — Encounter: Payer: Self-pay | Admitting: *Deleted

## 2016-10-17 DIAGNOSIS — B350 Tinea barbae and tinea capitis: Secondary | ICD-10-CM

## 2016-10-17 DIAGNOSIS — Z79899 Other long term (current) drug therapy: Secondary | ICD-10-CM | POA: Insufficient documentation

## 2016-10-17 DIAGNOSIS — I10 Essential (primary) hypertension: Secondary | ICD-10-CM | POA: Insufficient documentation

## 2016-10-17 DIAGNOSIS — F17200 Nicotine dependence, unspecified, uncomplicated: Secondary | ICD-10-CM | POA: Insufficient documentation

## 2016-10-17 DIAGNOSIS — L02811 Cutaneous abscess of head [any part, except face]: Secondary | ICD-10-CM | POA: Insufficient documentation

## 2016-10-17 LAB — COMPREHENSIVE METABOLIC PANEL
ALT: 15 U/L — ABNORMAL LOW (ref 17–63)
AST: 20 U/L (ref 15–41)
Albumin: 3.8 g/dL (ref 3.5–5.0)
Alkaline Phosphatase: 71 U/L (ref 38–126)
Anion gap: 8 (ref 5–15)
BUN: 12 mg/dL (ref 6–20)
CO2: 27 mmol/L (ref 22–32)
Calcium: 8.9 mg/dL (ref 8.9–10.3)
Chloride: 105 mmol/L (ref 101–111)
Creatinine, Ser: 1.31 mg/dL — ABNORMAL HIGH (ref 0.61–1.24)
GFR calc Af Amer: >60 mL/min (ref >60)
GFR calc non Af Amer: >60 mL/min (ref >60)
Glucose, Bld: 95 mg/dL (ref 65–99)
Potassium: 3.5 mmol/L (ref 3.5–5.1)
Sodium: 140 mmol/L (ref 135–145)
Total Bilirubin: 1 mg/dL (ref 0.3–1.2)
Total Protein: 6.6 g/dL (ref 6.5–8.1)

## 2016-10-17 LAB — CBC WITH DIFFERENTIAL/PLATELET
Basophils Absolute: 0.1 10*3/uL (ref 0–0.1)
Basophils Relative: 1 %
Eosinophils Absolute: 0.3 10*3/uL (ref 0–0.7)
Eosinophils Relative: 4 %
HCT: 38.8 % — ABNORMAL LOW (ref 40.0–52.0)
Hemoglobin: 13.3 g/dL (ref 13.0–18.0)
Lymphocytes Relative: 45 %
Lymphs Abs: 3.1 10*3/uL (ref 1.0–3.6)
MCH: 31.3 pg (ref 26.0–34.0)
MCHC: 34.2 g/dL (ref 32.0–36.0)
MCV: 91.5 fL (ref 80.0–100.0)
Monocytes Absolute: 0.6 10*3/uL (ref 0.2–1.0)
Monocytes Relative: 9 %
Neutro Abs: 2.8 10*3/uL (ref 1.4–6.5)
Neutrophils Relative %: 41 %
Platelets: 248 10*3/uL (ref 150–440)
RBC: 4.24 MIL/uL — ABNORMAL LOW (ref 4.40–5.90)
RDW: 12.9 % (ref 11.5–14.5)
WBC: 6.9 10*3/uL (ref 3.8–10.6)

## 2016-10-17 MED ORDER — IOPAMIDOL (ISOVUE-300) INJECTION 61%
75.0000 mL | Freq: Once | INTRAVENOUS | Status: AC | PRN
  Administered 2016-10-17: 75 mL via INTRAVENOUS
  Filled 2016-10-17: qty 75

## 2016-10-17 NOTE — ED Triage Notes (Signed)
Pt has itching rash on scalp for 1 week.  Pt alert

## 2016-10-17 NOTE — ED Provider Notes (Signed)
[facility] Emergency Department Provider Note  ____________________________________________  Time seen: Approximately 10:58 PM  I have reviewed the triage vital signs and the nursing notes.   HISTORY  Chief Complaint Rash    HPI Wesley Ward is a 56 y.o. male presenting to the emergency department with a 3cm x 3cm draining abscess at the vertex scalp. According to patient, abscess has been draining for approximately 7 days. Patient states that he has noticed associated right sided asymmetrical edema of the scalp as well as a 1 cm x 1 cm palpable anterior right cervical lymph node. Patient denies weight loss, weight gain, night sweats, fever or changes in energy. No knowledge of current malignancy. Patient states that scalp abscess has been extremely pruritic. No alleviating measures have been attempted.   Past Medical History:  Diagnosis Date  . Essential hypertension     Patient Active Problem List   Diagnosis Date Noted  . Preventative health care 07/04/2015  . Difficulty urinating 05/28/2015  . Essential hypertension 03/16/2015    Past Surgical History:  Procedure Laterality Date  . OTHER SURGICAL HISTORY     testiuclar surgery  . SURGERY SCROTAL / TESTICULAR      Prior to Admission medications   Medication Sig Start Date End Date Taking? Authorizing Provider  amLODipine (NORVASC) 10 MG tablet Take 1 tablet (10 mg total) by mouth daily. 08/24/16   Tommi RumpsSummers, Rhonda L, PA-C  atenolol (TENORMIN) 50 MG tablet TAKE 1 TABLET BY MOUTH ONCE A DAY 10/18/15   Doreene Nestlark, Katherine K, NP  naproxen (NAPROSYN) 500 MG tablet Take 1 tablet (500 mg total) by mouth 2 (two) times daily with a meal. 12/24/15   Hagler, Jami L, PA-C  tamsulosin (FLOMAX) 0.4 MG CAPS capsule TAKE 1 CAPSULE BY MOUTH ONCE DAILY 07/29/15   Doreene Nestlark, Katherine K, NP    Allergies Ace inhibitors  Family History  Problem Relation Age of Onset  . Cancer Father        Unsure  . Ovarian cancer  Mother   . Congestive Heart Failure Brother   . Hypertension Brother     Social History Social History  Substance Use Topics  . Smoking status: Current Some Day Smoker  . Smokeless tobacco: Never Used  . Alcohol use No     Review of Systems  Constitutional: No fever/chills Eyes: No visual changes. No discharge ENT: Patient has right sided palpable anterior cervical lymph node. Cardiovascular: no chest pain. Respiratory: no cough. No SOB. Gastrointestinal: No abdominal pain.  No nausea, no vomiting.  No diarrhea.  No constipation. Musculoskeletal: Negative for musculoskeletal pain. Skin:Patient has scalp abscess and asymmetrical edema of the right scalp. Neurological: Negative for headaches, focal weakness or numbness.  ____________________________________________   PHYSICAL EXAM:  VITAL SIGNS: ED Triage Vitals  Enc Vitals Group     BP 10/17/16 2032 (!) 164/97     Pulse Rate 10/17/16 2029 78     Resp 10/17/16 2029 20     Temp 10/17/16 2029 98.6 F (37 C)     Temp Source 10/17/16 2029 Oral     SpO2 10/17/16 2029 99 %     Weight 10/17/16 2031 228 lb (103.4 kg)     Height 10/17/16 2031 6\' 2"  (1.88 m)     Head Circumference --      Peak Flow --      Pain Score --      Pain Loc --      Pain Edu? --  Excl. in GC? --      Constitutional: Alert and oriented. Well appearing and in no acute distress. Eyes: Conjunctivae are normal. PERRL. EOMI. Head: Patient has a 3 cm x 3 cm, irregular abscess overlying the vertex scalp. No palpable induration. ENT:      Ears: Tympanic membranes are pearly bilaterally.      Nose: No congestion/rhinnorhea.      Mouth/Throat: Mucous membranes are moist.  Neck: Full range of motion. Hematological/Lymphatic/Immunilogical: Patient has a 1 cm x 1 cm palpable right cervical lymph node. Cardiovascular: Normal rate, regular rhythm. Normal S1 and S2.  Good peripheral circulation. Respiratory: Normal respiratory effort without tachypnea or  retractions. Lungs CTAB. Good air entry to the bases with no decreased or absent breath sounds. Musculoskeletal: Full range of motion to all extremities. No gross deformities appreciated. Neurologic:  Normal speech and language. No gross focal neurologic deficits are appreciated.  Skin: Patient has asymmetrical edema of right scalp. Psychiatric: Mood and affect are normal. Speech and behavior are normal. Patient exhibits appropriate insight and judgement.   ____________________________________________   LABS (all labs ordered are listed, but only abnormal results are displayed)  Labs Reviewed  CBC WITH DIFFERENTIAL/PLATELET - Abnormal; Notable for the following:       Result Value   RBC 4.24 (*)    HCT 38.8 (*)    All other components within normal limits  COMPREHENSIVE METABOLIC PANEL - Abnormal; Notable for the following:    Creatinine, Ser 1.31 (*)    ALT 15 (*)    All other components within normal limits   ____________________________________________  EKG   ____________________________________________  RADIOLOGY   No results found.  ____________________________________________    PROCEDURES  Procedure(s) performed:    Procedures    Medications  iopamidol (ISOVUE-300) 61 % injection 75 mL (75 mLs Intravenous Contrast Given 10/17/16 2344)     ____________________________________________   INITIAL IMPRESSION / ASSESSMENT AND PLAN / ED COURSE  Pertinent labs & imaging results that were available during my care of the patient were reviewed by me and considered in my medical decision making (see chart for details).  Review of the [place] CSRS was performed in accordance of the [facility] prior to dispensing any controlled drugs.    Assessment and plan Scalp abscess Lymphadenitis Patient presents to the emergency department with a 3 cm x 3 cm scalp abscess, asymmetrical edema of the right scalp, and a 1 cm x 1 cm palpable right cervical lymph node. Patient received  workup in the emergency department with a CT head with contrast and a CT soft tissue neck. Patient was transitioned to main side of the emergency department for further care and management as [facility] closed for the evening. Dr. Willy Eddy assumed patient care. All patient questions were answered.   ____________________________________________  FINAL CLINICAL IMPRESSION(S) / ED DIAGNOSES  Final diagnoses:  Scalp abscess      NEW MEDICATIONS STARTED DURING THIS VISIT:  New Prescriptions   No medications on file        This chart was dictated using voice recognition software/Dragon. Despite best efforts to proofread, errors can occur which can change the meaning. Any change was purely unintentional.    Orvil Feil, PA-C 10/17/16 2351    Willy Eddy, MD 10/18/16 [phone]

## 2016-10-18 MED ORDER — AMLODIPINE BESYLATE 10 MG PO TABS: 10.0000 mg | ORAL_TABLET | Freq: Every day | ORAL | 0 refills | Status: DC

## 2016-10-18 MED ORDER — GRISEOFULVIN MICROSIZE 500 MG PO TABS: 500.0000 mg | ORAL_TABLET | Freq: Every day | ORAL | 1 refills | Status: AC

## 2016-10-18 NOTE — Discharge Instructions (Signed)
SCALP INFECTION (TINEA CAPITIS) -- Tinea capitis usually causes a scaly, red rash that can lead to bald patches on the scalp (picture 1A-B). It usually affects children and is the most common cause of hair loss among children. It only rarely affects adults. Scalp infections are treated with prescription antifungal medicines that you take by mouth. Topical treatments (lotions or creams) for tinea infections do not work on scalp infections. Treatment usually requires taking the medication once or twice per day for 2 to 12 weeks depending on the type of medication given and how well the infection responds to treatment.  To prevent tinea capitis from recurring, it's important to get rid of any combs, brushes, barrettes, or other hair care products that could be harboring the fungus. Family members should also be checked and treated, if necessary. You can carry and spread the fungus but show no signs of infection; this person is called a carrier. In cases where the family pet is suspected to be the source of the infection, it's also important to have the animal treated. If your child is being treated for tinea capitis with oral antifungal drugs, s/he can still go to school. There is no need to shave your child's head or cut their hair.  PREVENTING RINGWORM -- To prevent ringworm and other skin infections: ?Do not share clothing, sports equipment, or towels with other people. ?When at the gym, local pool, or other public areas (including the shower), always wear slippers or sandals. ?Wash thoroughly with soap and shampoo after any sport involving skin-to-skin contact. ?Avoid tight-fitting clothing. Change your socks and underwear at least once a day. ?Keep your skin clean and dry. Always dry yourself completely after bathing. ?If you have athlete's foot, put your socks on before your underwear so that the infection does not spread to other parts of your body. ?Take your pet to the vet if it has patches of  missing hair or a rash. That could be a sign of a tinea infection. ?If you or someone in your family has symptoms of ringworm, make sure s/he is treated right away. Otherwise, the infection may spread.

## 2016-11-30 ENCOUNTER — Ambulatory Visit: Payer: Self-pay

## 2017-03-07 ENCOUNTER — Other Ambulatory Visit: Payer: Self-pay | Admitting: Primary Care

## 2017-03-07 DIAGNOSIS — I10 Essential (primary) hypertension: Secondary | ICD-10-CM

## 2017-03-07 DIAGNOSIS — Z0000 Encounter for general adult medical examination without abnormal findings: Secondary | ICD-10-CM

## 2017-03-15 ENCOUNTER — Encounter: Payer: Self-pay | Admitting: *Deleted

## 2017-03-15 ENCOUNTER — Other Ambulatory Visit: Payer: Self-pay

## 2017-03-15 ENCOUNTER — Ambulatory Visit (INDEPENDENT_AMBULATORY_CARE_PROVIDER_SITE_OTHER): Payer: BLUE CROSS/BLUE SHIELD | Admitting: Primary Care

## 2017-03-15 ENCOUNTER — Encounter: Payer: Self-pay | Admitting: Primary Care

## 2017-03-15 VITALS — BP 162/104 | HR 58 | Temp 97.7°F | Ht 74.0 in | Wt 231.4 lb

## 2017-03-15 DIAGNOSIS — I10 Essential (primary) hypertension: Secondary | ICD-10-CM

## 2017-03-15 DIAGNOSIS — R39198 Other difficulties with micturition: Secondary | ICD-10-CM | POA: Diagnosis not present

## 2017-03-15 DIAGNOSIS — Z1211 Encounter for screening for malignant neoplasm of colon: Secondary | ICD-10-CM

## 2017-03-15 DIAGNOSIS — Z0000 Encounter for general adult medical examination without abnormal findings: Secondary | ICD-10-CM | POA: Diagnosis not present

## 2017-03-15 LAB — COMPREHENSIVE METABOLIC PANEL
ALBUMIN: 4.1 g/dL (ref 3.5–5.2)
ALK PHOS: 68 U/L (ref 39–117)
ALT: 16 U/L (ref 0–53)
AST: 18 U/L (ref 0–37)
BUN: 16 mg/dL (ref 6–23)
CALCIUM: 9.1 mg/dL (ref 8.4–10.5)
CO2: 29 mEq/L (ref 19–32)
CREATININE: 1.03 mg/dL (ref 0.40–1.50)
Chloride: 106 mEq/L (ref 96–112)
GFR: 96.35 mL/min (ref >60.00)
Glucose, Bld: 102 mg/dL — ABNORMAL HIGH (ref 70–99)
Potassium: 4.1 mEq/L (ref 3.5–5.1)
Sodium: 141 mEq/L (ref 135–145)
TOTAL PROTEIN: 7 g/dL (ref 6.0–8.3)
Total Bilirubin: 0.7 mg/dL (ref 0.2–1.2)

## 2017-03-15 LAB — LIPID PANEL
CHOLESTEROL: 176 mg/dL (ref 0–200)
HDL: 42.6 mg/dL (ref >39.00)
LDL Cholesterol: 118 mg/dL — ABNORMAL HIGH (ref 0–99)
NonHDL: 133.38
TRIGLYCERIDES: 78 mg/dL (ref 0.0–149.0)
Total CHOL/HDL Ratio: 4
VLDL: 15.6 mg/dL (ref 0.0–40.0)

## 2017-03-15 MED ORDER — TAMSULOSIN HCL 0.4 MG PO CAPS: 0.4000 mg | ORAL_CAPSULE | Freq: Every day | ORAL | 1 refills | Status: DC

## 2017-03-15 MED ORDER — AMLODIPINE BESYLATE 10 MG PO TABS: 10.0000 mg | ORAL_TABLET | Freq: Every day | ORAL | 0 refills | Status: DC

## 2017-03-15 MED ORDER — AMLODIPINE BESYLATE 10 MG PO TABS: 10.0000 mg | ORAL_TABLET | Freq: Every day | ORAL | 3 refills | Status: DC

## 2017-03-15 NOTE — Assessment & Plan Note (Signed)
Td UTD per patient, declines influenza vaccination. PSA UTD. Colonoscopy due, referral placed. Recommended regular exercise, improve diet. Exam unremarkable. Labs pending. BP above goal, has not had meds in 2 weeks. Follow up in 1 year.

## 2017-03-15 NOTE — Patient Instructions (Addendum)
Stop by the lab prior to leaving today. I will notify you of your results once received.   Start exercising. You should be getting 150 minutes of moderate intensity exercise weekly.  Increase consumption of vegetables, fruit, whole grains, water.  Monitor your blood pressure and notify me if you get readings at or above 135/90 on a consistent basis.   Follow up in 1 year for your annual exam or sooner if needed.   It was a pleasure to see you today!   Hypertension Hypertension, commonly called high blood pressure, is when the force of blood pumping through the arteries is too strong. The arteries are the blood vessels that carry blood from the heart throughout the body. Hypertension forces the heart to work harder to pump blood and may cause arteries to become narrow or stiff. Having untreated or uncontrolled hypertension can cause heart attacks, strokes, kidney disease, and other problems. A blood pressure reading consists of a higher number over a lower number. Ideally, your blood pressure should be below 120/80. The first ("top") number is called the systolic pressure. It is a measure of the pressure in your arteries as your heart beats. The second ("bottom") number is called the diastolic pressure. It is a measure of the pressure in your arteries as the heart relaxes. What are the causes? The cause of this condition is not known. What increases the risk? Some risk factors for high blood pressure are under your control. Others are not. Factors you can change  Smoking.  Having type 2 diabetes mellitus, high cholesterol, or both.  Not getting enough exercise or physical activity.  Being overweight.  Having too much fat, sugar, calories, or salt (sodium) in your diet.  Drinking too much alcohol. Factors that are difficult or impossible to change  Having chronic kidney disease.  Having a family history of high blood pressure.  Age. Risk increases with age.  Race. You may be at  higher risk if you are African-American.  Gender. Men are at higher risk than women before age 56. After age 56, women are at higher risk than men.  Having obstructive sleep apnea.  Stress. What are the signs or symptoms? Extremely high blood pressure (hypertensive crisis) may cause:  Headache.  Anxiety.  Shortness of breath.  Nosebleed.  Nausea and vomiting.  Severe chest pain.  Jerky movements you cannot control (seizures).  How is this diagnosed? This condition is diagnosed by measuring your blood pressure while you are seated, with your arm resting on a surface. The cuff of the blood pressure monitor will be placed directly against the skin of your upper arm at the level of your heart. It should be measured at least twice using the same arm. Certain conditions can cause a difference in blood pressure between your right and left arms. Certain factors can cause blood pressure readings to be lower or higher than normal (elevated) for a short period of time:  When your blood pressure is higher when you are in a health care provider's office than when you are at home, this is called white coat hypertension. Most people with this condition do not need medicines.  When your blood pressure is higher at home than when you are in a health care provider's office, this is called masked hypertension. Most people with this condition may need medicines to control blood pressure.  If you have a high blood pressure reading during one visit or you have normal blood pressure with other risk factors:  You may be asked to return on a different day to have your blood pressure checked again.  You may be asked to monitor your blood pressure at home for 1 week or longer.  If you are diagnosed with hypertension, you may have other blood or imaging tests to help your health care provider understand your overall risk for other conditions. How is this treated? This condition is treated by making  healthy lifestyle changes, such as eating healthy foods, exercising more, and reducing your alcohol intake. Your health care provider may prescribe medicine if lifestyle changes are not enough to get your blood pressure under control, and if:  Your systolic blood pressure is above 130.  Your diastolic blood pressure is above 80.  Your personal target blood pressure may vary depending on your medical conditions, your age, and other factors. Follow these instructions at home: Eating and drinking  Eat a diet that is high in fiber and potassium, and low in sodium, added sugar, and fat. An example eating plan is called the DASH (Dietary Approaches to Stop Hypertension) diet. To eat this way: ? Eat plenty of fresh fruits and vegetables. Try to fill half of your plate at each meal with fruits and vegetables. ? Eat whole grains, such as whole wheat pasta, brown rice, or whole grain bread. Fill about one quarter of your plate with whole grains. ? Eat or drink low-fat dairy products, such as skim milk or low-fat yogurt. ? Avoid fatty cuts of meat, processed or cured meats, and poultry with skin. Fill about one quarter of your plate with lean proteins, such as fish, chicken without skin, beans, eggs, and tofu. ? Avoid premade and processed foods. These tend to be higher in sodium, added sugar, and fat.  Reduce your daily sodium intake. Most people with hypertension should eat less than 1,500 mg of sodium a day.  Limit alcohol intake to no more than 1 drink a day for nonpregnant women and 2 drinks a day for men. One drink equals 12 oz of beer, 5 oz of wine, or 1 oz of hard liquor. Lifestyle  Work with your health care provider to maintain a healthy body weight or to lose weight. Ask what an ideal weight is for you.  Get at least 30 minutes of exercise that causes your heart to beat faster (aerobic exercise) most days of the week. Activities may include walking, swimming, or biking.  Include exercise  to strengthen your muscles (resistance exercise), such as pilates or lifting weights, as part of your weekly exercise routine. Try to do these types of exercises for 30 minutes at least 3 days a week.  Do not use any products that contain nicotine or tobacco, such as cigarettes and e-cigarettes. If you need help quitting, ask your health care provider.  Monitor your blood pressure at home as told by your health care provider.  Keep all follow-up visits as told by your health care provider. This is important. Medicines  Take over-the-counter and prescription medicines only as told by your health care provider. Follow directions carefully. Blood pressure medicines must be taken as prescribed.  Do not skip doses of blood pressure medicine. Doing this puts you at risk for problems and can make the medicine less effective.  Ask your health care provider about side effects or reactions to medicines that you should watch for. Contact a health care provider if:  You think you are having a reaction to a medicine you are taking.  You have  headaches that keep coming back (recurring).  You feel dizzy.  You have swelling in your ankles.  You have trouble with your vision. Get help right away if:  You develop a severe headache or confusion.  You have unusual weakness or numbness.  You feel faint.  You have severe pain in your chest or abdomen.  You vomit repeatedly.  You have trouble breathing. Summary  Hypertension is when the force of blood pumping through your arteries is too strong. If this condition is not controlled, it may put you at risk for serious complications.  Your personal target blood pressure may vary depending on your medical conditions, your age, and other factors. For most people, a normal blood pressure is less than 120/80.  Hypertension is treated with lifestyle changes, medicines, or a combination of both. Lifestyle changes include weight loss, eating a healthy,  low-sodium diet, exercising more, and limiting alcohol. This information is not intended to replace advice given to you by your health care provider. Make sure you discuss any questions you have with your health care provider. Document Released: 02/27/2005 Document Revised: 01/26/2016 Document Reviewed: 01/26/2016 Elsevier Interactive Patient Education  [employer].

## 2017-03-15 NOTE — Assessment & Plan Note (Signed)
Improved on Flomax, did experience leg cramps but would like to try again. Refills sent to pharmacy. PSA negative in 2017.

## 2017-03-15 NOTE — Progress Notes (Signed)
Subjective:    Patient ID: Wesley Ward, male    DOB: 1960-10-16, 57 y.o.   MRN: [id number]  HPI  Wesley Ward is a 57 year old male who presents today for complete physical. He's been out of his amlodipine 10 mg tablets for several weeks.   Immunizations: -Tetanus: Completed in 2016. -Influenza: Declines   Diet: He endorses a poor diet. Breakfast: Skips during week, weekend eats eggs, grits, bacon, liver pudding Lunch: Skips during week, weekend eats sandwich Dinner: Oatmeal, cream of wheat, some vegetables, fruit, chicken Snacks: Fruit Desserts: Daily (ice cream) Beverages: Water, coffee  Exercise: He does not currently exercise Eye exam: Completed several years ago Dental exam: Not completed recently Colonoscopy: Due, never completed  PSA: Negative in 2017   Review of Systems  Constitutional: Negative for unexpected weight change.  HENT: Negative for rhinorrhea.   Respiratory: Negative for cough and shortness of breath.   Cardiovascular: Negative for chest pain.  Gastrointestinal: Negative for constipation and diarrhea.  Genitourinary: Negative for difficulty urinating.  Musculoskeletal: Negative for arthralgias and myalgias.  Skin: Negative for rash.  Allergic/Immunologic: Negative for environmental allergies.  Neurological: Negative for dizziness, numbness and headaches.       Intermittent headaches  Psychiatric/Behavioral:       Denies concerns for anxiety and depression       Past Medical History:  Diagnosis Date  . Essential hypertension      Social History   Socioeconomic History  . Marital status: Married    Spouse name: Not on file  . Number of children: Not on file  . Years of education: Not on file  . Highest education level: Not on file  Social Needs  . Financial resource strain: Not on file  . Food insecurity - worry: Not on file  . Food insecurity - inability: Not on file  . Transportation needs - medical: Not on file  .  Transportation needs - non-medical: Not on file  Occupational History  . Not on file  Tobacco Use  . Smoking status: Current Some Day Smoker  . Smokeless tobacco: Never Used  Substance and Sexual Activity  . Alcohol use: No    Alcohol/week: 0.0 oz  . Drug use: No  . Sexual activity: Not on file  Other Topics Concern  . Not on file  Social History Narrative   Married.   4 children.   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   Enjoys doing comedy.     Past Surgical History:  Procedure Laterality Date  . OTHER SURGICAL HISTORY     testiuclar surgery  . SURGERY SCROTAL / TESTICULAR      Family History  Problem Relation Age of Onset  . Cancer Father        Unsure  . Ovarian cancer Mother   . Congestive Heart Failure Brother   . Hypertension Brother     Allergies  Allergen Reactions  . Ace Inhibitors Swelling    No current outpatient medications on file prior to visit.   No current facility-administered medications on file prior to visit.     BP (!) 162/104   Pulse (!) 58   Temp 97.7 F (36.5 C) (Oral)   Ht 6\' 2"  (1.88 m)   Wt 231 lb 6.4 oz (105 kg)   SpO2 95%   BMI 29.71 kg/m    Objective:   Physical Exam  Constitutional: He is oriented to person, place, and time. He appears  well-nourished.  HENT:  Right Ear: Tympanic membrane and ear canal normal.  Left Ear: Tympanic membrane and ear canal normal.  Nose: Nose normal. Right sinus exhibits no maxillary sinus tenderness and no frontal sinus tenderness. Left sinus exhibits no maxillary sinus tenderness and no frontal sinus tenderness.  Mouth/Throat: Oropharynx is clear and moist.  Eyes: Conjunctivae and EOM are normal. Pupils are equal, round, and reactive to light.  Neck: Neck supple. Carotid bruit is not present. No thyromegaly present.  Cardiovascular: Normal rate, regular rhythm and normal heart sounds.  Pulmonary/Chest: Effort normal and breath sounds normal. He has no wheezes. He has no  rales.  Abdominal: Soft. Bowel sounds are normal. There is no tenderness.  Musculoskeletal: Normal range of motion.  Neurological: He is alert and oriented to person, place, and time. He has normal reflexes. No cranial nerve deficit.  Skin: Skin is warm and dry.  Psychiatric: He has a normal mood and affect.          Assessment & Plan:

## 2017-03-15 NOTE — Assessment & Plan Note (Signed)
BP elevated in the clinic today, out of meds for the past 2 weeks. Refills sent to pharmacy. He will monitor BP at home and report readings at or above 135/90.

## 2017-04-03 ENCOUNTER — Encounter: Payer: Self-pay | Admitting: *Deleted

## 2017-04-03 ENCOUNTER — Telehealth: Payer: Self-pay | Admitting: Primary Care

## 2017-04-03 NOTE — Telephone Encounter (Signed)
I spoke with Mrs Wesley Ward and [place] is going to [place] and will f/u with appt on 04/06/17 at 7:15 with Mayra ReelKate Clark NP. Lorain ChildesFYI to [place].

## 2017-04-03 NOTE — Telephone Encounter (Signed)
Pt's wife called to make an appointment for her husband. [place] and called her because he is having some symptoms of feeling lightheaded, lethargic and sweating. The last time he felt like this his b/p was elevated and he was put on medication.  Advised her to tell [facility]. She voiced understanding and an appointment was made for him with his pcp.

## 2017-04-03 NOTE — Telephone Encounter (Signed)
This encounter was created in error - please disregard.

## 2017-04-03 NOTE — Telephone Encounter (Signed)
Noted  

## 2017-04-06 ENCOUNTER — Ambulatory Visit: Payer: BLUE CROSS/BLUE SHIELD | Admitting: Primary Care

## 2017-04-06 ENCOUNTER — Encounter: Payer: Self-pay | Admitting: Primary Care

## 2017-04-06 VITALS — BP 142/82 | HR 70 | Temp 97.7°F | Ht 74.0 in | Wt 225.0 lb

## 2017-04-06 DIAGNOSIS — I10 Essential (primary) hypertension: Secondary | ICD-10-CM

## 2017-04-06 DIAGNOSIS — L989 Disorder of the skin and subcutaneous tissue, unspecified: Secondary | ICD-10-CM

## 2017-04-06 MED ORDER — HYDROCHLOROTHIAZIDE 12.5 MG PO CAPS: 12.5000 mg | ORAL_CAPSULE | Freq: Every day | ORAL | 0 refills | Status: DC

## 2017-04-06 NOTE — Progress Notes (Signed)
Subjective:    Patient ID: Wesley Ward, male    DOB: 15-Dec-1960, 57 y.o.   MRN: [id number]  HPI  Wesley Ward is a 57 year old male with a history of hypertension who presents today with a chief complaint of hypertension.    He called into our phone system on 04/03/17 with complaints of feeling lightheaded, lethargic, and sweating. He endorsed feeling this way when his BP was too high. He was advised to report to urgent care due to lack of available appointments.   Since his last visit he visited [facility] on 03/26/17. His BP at [facility] was 140/80. He underwent ECG (?AV accelerated junction rhythm) and labs which were unremarkable. He denies dizziness/lightheadedness, chest pain, headaches. He's checking his BP at home and thinks it's running 140-150/80's.   BP Readings from Last 3 Encounters:  04/06/17 (!) 142/82  03/15/17 (!) 162/104  10/18/16 (!) 169/114   2) Lesions: He's noticed lesions to the top of his head for the last 4-6 months. They are round spots that will wax and wane in size and will itch. He's not taken anything for these spots. He denies changes in food, medication, soaps/detergents. He'd like to be referred to dermatology.  Review of Systems  Constitutional: Negative for fatigue.  Respiratory: Negative for shortness of breath.   Cardiovascular: Negative for chest pain.  Neurological: Negative for dizziness and headaches.       Past Medical History:  Diagnosis Date  . Essential hypertension      Social History   Socioeconomic History  . Marital status: Married    Spouse name: Not on file  . Number of children: Not on file  . Years of education: Not on file  . Highest education level: Not on file  Social Needs  . Financial resource strain: Not on file  . Food insecurity - worry: Not on file  . Food insecurity - inability: Not on file  . Transportation needs - medical: Not on file  . Transportation needs - non-medical: Not on file    Occupational History  . Not on file  Tobacco Use  . Smoking status: Current Some Day Smoker  . Smokeless tobacco: Never Used  Substance and Sexual Activity  . Alcohol use: No    Alcohol/week: 0.0 oz  . Drug use: No  . Sexual activity: Not on file  Other Topics Concern  . Not on file  Social History Narrative   Married.   4 children.   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   Enjoys doing comedy.     Past Surgical History:  Procedure Laterality Date  . OTHER SURGICAL HISTORY     testiuclar surgery  . SURGERY SCROTAL / TESTICULAR      Family History  Problem Relation Age of Onset  . Cancer Father        Unsure  . Ovarian cancer Mother   . Congestive Heart Failure Brother   . Hypertension Brother     Allergies  Allergen Reactions  . Ace Inhibitors Swelling    Current Outpatient Medications on File Prior to Visit  Medication Sig Dispense Refill  . amLODipine (NORVASC) 10 MG tablet Take 1 tablet (10 mg total) by mouth daily. 90 tablet 3  . tamsulosin (FLOMAX) 0.4 MG CAPS capsule Take 1 capsule (0.4 mg total) by mouth daily. 90 capsule 1   No current facility-administered medications on file prior to visit.  BP (!) 142/82   Pulse 70   Temp 97.7 F (36.5 C) (Oral)   Ht 6\' 2"  (1.88 m)   Wt 225 lb (102.1 kg)   SpO2 97%   BMI 28.89 kg/m    Objective:   Physical Exam  Constitutional: He appears well-nourished.  Cardiovascular: Normal rate, regular rhythm and normal heart sounds.  Pulmonary/Chest: Effort normal and breath sounds normal.  Skin: Skin is warm and dry.  3-4 rounded, raised, non tender, intact whitish-flesh colored spots to parietal area of head.           Assessment & Plan:  Skin Lesions:  Located to top of head x 4-6 months, no resolve. Exam today doesn't appear suspicious, could be fungal, not bacterial. Referral placed to dermatology as requested.  Morrie Sheldon, NP

## 2017-04-06 NOTE — Patient Instructions (Signed)
Continue Amlodipine 10 mg for high blood pressure.  Start hydrochlorothiazide 12.5 mg for high blood pressure. Take 1 capsule by mouth once daily.  Schedule a follow up visit in 2-3 weeks for blood pressure check and labs.  It was a pleasure to see you today!

## 2017-04-06 NOTE — Assessment & Plan Note (Signed)
Improved since restarting Amlodipine 10 mg but above goal. Add in HCTZ 12.5 mg once daily.  Suspect symptoms secondary to restarting BP meds, especially since he's not experienced them since. Repeat ECG today with NSR, no junctional rhythm, ST elevation/depression.  Follow up in 2-3 weeks for BP check and labs.

## 2017-04-09 ENCOUNTER — Other Ambulatory Visit: Payer: Self-pay

## 2017-04-09 ENCOUNTER — Telehealth: Payer: Self-pay

## 2017-04-09 DIAGNOSIS — Z1211 Encounter for screening for malignant neoplasm of colon: Secondary | ICD-10-CM

## 2017-04-09 NOTE — Telephone Encounter (Signed)
Gastroenterology Pre-Procedure Review  Request Date:   Requesting Physician: Dr.   PATIENT REVIEW QUESTIONS: The patient responded to the following health history questions as indicated:    1. Are you having any GI issues? No  2. Do you have a personal history of Polyps? No  3. Do you have a family history of Colon Cancer or Polyps? No  4. Diabetes Mellitus? No  5. Joint replacements in the past 12 months? No  6. Major health problems in the past 3 months? No  7. Any artificial heart valves, MVP, or defibrillator? No     MEDICATIONS & ALLERGIES:    Patient reports the following regarding taking any anticoagulation/antiplatelet therapy:   Plavix, Coumadin, Eliquis, Xarelto, Lovenox, Pradaxa, Brilinta, or Effient? No  Aspirin? No   Patient confirms/reports the following medications:  Current Outpatient Medications  Medication Sig Dispense Refill  . amLODipine (NORVASC) 10 MG tablet Take 1 tablet (10 mg total) by mouth daily. 90 tablet 3  . hydrochlorothiazide (MICROZIDE) 12.5 MG capsule Take 1 capsule (12.5 mg total) by mouth daily. 90 capsule 0  . tamsulosin (FLOMAX) 0.4 MG CAPS capsule Take 1 capsule (0.4 mg total) by mouth daily. 90 capsule 1   No current facility-administered medications for this visit.     Patient confirms/reports the following allergies:  Allergies  Allergen Reactions  . Ace Inhibitors Swelling    No orders of the defined types were placed in this encounter.   AUTHORIZATION INFORMATION Primary Insurance: 1D#: Group #:  Secondary Insurance: 1D#: Group #:  SCHEDULE INFORMATION: Date: 05/23/17 Time: Location: [place]

## 2017-05-17 ENCOUNTER — Other Ambulatory Visit: Payer: Self-pay

## 2017-05-17 ENCOUNTER — Encounter: Payer: Self-pay | Admitting: *Deleted

## 2017-05-18 ENCOUNTER — Encounter: Payer: Self-pay | Admitting: Anesthesiology

## 2017-05-21 ENCOUNTER — Telehealth: Payer: Self-pay | Admitting: Gastroenterology

## 2017-05-21 NOTE — Telephone Encounter (Signed)
Patient called to cancel his procedure due to other medical issues. He will call back to reschedule. I called [facility] to let them know.

## 2017-05-23 ENCOUNTER — Ambulatory Visit
Admission: RE | Admit: 2017-05-23 | Payer: BLUE CROSS/BLUE SHIELD | Source: Ambulatory Visit | Admitting: Gastroenterology

## 2017-05-23 SURGERY — COLONOSCOPY WITH PROPOFOL
Anesthesia: Choice

## 2017-10-01 ENCOUNTER — Ambulatory Visit: Payer: Self-pay | Admitting: *Deleted

## 2017-10-01 NOTE — Telephone Encounter (Signed)
Pt has appt 10/02/17 at 12 noon with Pamala Hurry Baity NP.

## 2017-10-01 NOTE — Telephone Encounter (Signed)
Pt called with having right neck pain off and on for a while but now it has become more frequent. It comes when he turns his head and it is a burning pain.Marland Kitchen. He has not taken anything for it. He says that he just grabs his neck when that happens. It normally lasts for about 6 or 7 seconds. His neck is not stiff.  Home care advice given to him with verbal understanding.  Appointment scheduled per protocol. Pt also is having tailbone pain. No protocol for this. He has not injured himself. It is there after the sits for a long period of time and tries to move (feels like an old man). Advised to try taking ibuprofen until seen by his provider. Pt voiced understanding. [facility]. Advised to call back of worsening symptoms or increase in symptoms such as blurred vision, shortness of breath, weakness, or headache.  Reason for Disposition . Neck pain present > 2 weeks  Answer Assessment - Initial Assessment Questions 1. ONSET: "When did the pain begin?"      Yesterday but had been going on for a while and stopped 2. LOCATION: "Where does it hurt?"      On the right side 3. PATTERN "Does the pain come and go, or has it been constant since it started?"      There when he moves his neck 4. SEVERITY: "How bad is the pain?"  (Scale 1-10; or mild, moderate, severe)   - MILD (1-3): doesn't interfere with normal activities    - MODERATE (4-7): interferes with normal activities or awakens from sleep    - SEVERE (8-10):  excruciating pain, unable to do any normal activities      Pain #10 turning head and about 3 otherwise 5. RADIATION: "Does the pain go anywhere else, shoot into your arms?"     no 6. CORD SYMPTOMS: "Any weakness or numbness of the arms or legs?"     no 7. CAUSE: "What do you think is causing the neck pain?"     Not sure 8. NECK OVERUSE: "Any recent activities that involved turning or twisting the neck?"     Turning neck makes it worst 9. OTHER SYMPTOMS:  "Do you have any other symptoms?" (e.g., headache, fever, chest pain, difficulty breathing, neck swelling)     no  Protocols used: NECK PAIN OR STIFFNESS-A-AH

## 2017-10-02 ENCOUNTER — Encounter: Payer: Self-pay | Admitting: Internal Medicine

## 2017-10-02 ENCOUNTER — Ambulatory Visit (INDEPENDENT_AMBULATORY_CARE_PROVIDER_SITE_OTHER)
Admission: RE | Admit: 2017-10-02 | Discharge: 2017-10-02 | Disposition: A | Discharge status: Home / Self Care | Payer: Self-pay | Source: Ambulatory Visit | Attending: Internal Medicine | Admitting: Internal Medicine

## 2017-10-02 ENCOUNTER — Ambulatory Visit (INDEPENDENT_AMBULATORY_CARE_PROVIDER_SITE_OTHER): Payer: Self-pay | Admitting: Internal Medicine

## 2017-10-02 VITALS — BP 152/90 | HR 72 | Temp 97.8°F | Wt 235.2 lb

## 2017-10-02 DIAGNOSIS — M542 Cervicalgia: Secondary | ICD-10-CM

## 2017-10-02 DIAGNOSIS — M533 Sacrococcygeal disorders, not elsewhere classified: Secondary | ICD-10-CM

## 2017-10-02 MED ORDER — ORPHENADRINE CITRATE ER 100 MG PO TB12: 100.0000 mg | ORAL_TABLET | Freq: Two times a day (BID) | ORAL | 0 refills | Status: DC

## 2017-10-02 MED ORDER — NAPROXEN 375 MG PO TABS: 375.0000 mg | ORAL_TABLET | Freq: Two times a day (BID) | ORAL | 0 refills | Status: DC

## 2017-10-02 NOTE — Patient Instructions (Signed)
Neck Exercises Neck exercises can be important for many reasons:  They can help you to improve and maintain flexibility in your neck. This can be especially important as you age.  They can help to make your neck stronger. This can make movement easier.  They can reduce or prevent neck pain.  They may help your upper back.  Ask your health care provider which neck exercises would be best for you. Exercises Neck Press Repeat this exercise 10 times. Do it first thing in the morning and right before bed or as told by your health care provider. 1. Lie on your back on a firm bed or on the floor with a pillow under your head. 2. Use your neck muscles to push your head down on the pillow and straighten your spine. 3. Hold the position as well as you can. Keep your head facing up and your chin tucked. 4. Slowly count to 5 while holding this position. 5. Relax for a few seconds. Then repeat.  Isometric Strengthening Do a full set of these exercises 2 times a day or as told by your health care provider. 1. Sit in a supportive chair and place your hand on your forehead. 2. Push forward with your head and neck while pushing back with your hand. Hold for 10 seconds. 3. Relax. Then repeat the exercise 3 times. 4. Next, do thesequence again, this time putting your hand against the back of your head. Use your head and neck to push backward against the hand pressure. 5. Finally, do the same exercise on either side of your head, pushing sideways against the pressure of your hand.  Prone Head Lifts Repeat this exercise 5 times. Do this 2 times a day or as told by your health care provider. 1. Lie face-down, resting on your elbows so that your chest and upper back are raised. 2. Start with your head facing downward, near your chest. Position your chin either on or near your chest. 3. Slowly lift your head upward. Lift until you are looking straight ahead. Then continue lifting your head as far back as  you can stretch. 4. Hold your head up for 5 seconds. Then slowly lower it to your starting position.  Supine Head Lifts Repeat this exercise 8-10 times. Do this 2 times a day or as told by your health care provider. 1. Lie on your back, bending your knees to point to the ceiling and keeping your feet flat on the floor. 2. Lift your head slowly off the floor, raising your chin toward your chest. 3. Hold for 5 seconds. 4. Relax and repeat.  Scapular Retraction Repeat this exercise 5 times. Do this 2 times a day or as told by your health care provider. 1. Stand with your arms at your sides. Look straight ahead. 2. Slowly pull both shoulders backward and downward until you feel a stretch between your shoulder blades in your upper back. 3. Hold for 10-30 seconds. 4. Relax and repeat.  Contact a health care provider if:  Your neck pain or discomfort gets much worse when you do an exercise.  Your neck pain or discomfort does not improve within 2 hours after you exercise. If you have any of these problems, stop exercising right away. Do not do the exercises again unless your health care provider says that you can. Get help right away if:  You develop sudden, severe neck pain. If this happens, stop exercising right away. Do not do the exercises again unless your   health care provider says that you can. Exercises Neck Stretch  Repeat this exercise 3-5 times. 1. Do this exercise while standing or while sitting in a chair. 2. Place your feet flat on the floor, shoulder-width apart. 3. Slowly turn your head to the right. Turn it all the way to the right so you can look over your right shoulder. Do not tilt or tip your head. 4. Hold this position for 10-30 seconds. 5. Slowly turn your head to the left, to look over your left shoulder. 6. Hold this position for 10-30 seconds.  Neck Retraction Repeat this exercise 8-10 times. Do this 3-4 times a day or as told by your health care  provider. 1. Do this exercise while standing or while sitting in a sturdy chair. 2. Look straight ahead. Do not bend your neck. 3. Use your fingers to push your chin backward. Do not bend your neck for this movement. Continue to face straight ahead. If you are doing the exercise properly, you will feel a slight sensation in your throat and a stretch at the back of your neck. 4. Hold the stretch for 1-2 seconds. Relax and repeat.  This information is not intended to replace advice given to you by your health care provider. Make sure you discuss any questions you have with your health care provider. Document Released: 02/08/2015 Document Revised: 08/05/2015 Document Reviewed: 09/07/2014 Elsevier Interactive Patient Education  2018 [employer]  

## 2017-10-02 NOTE — Progress Notes (Signed)
Subjective:    Patient ID: Wesley Ward, male    DOB: 06-Jan-1961, 57 y.o.   MRN: [id number]  HPI  Pt presents to the clinic today with c/o right sided neck pain. He noticed this a few days ago. He reports he woke up with pain. He describes the pain as tight and burning. The pain is worse with movement. The pain does not radiate. He denies headache, dizziness, hearing changes. He denies numbness, tingling or weakness in the right arm. He has not tried anything OTC for his symptoms.   He also reports tailbone pain over the last few months. He describes this pain as achy. The pain does not radiate. He denies any injury to the area. He has not taken anything OTC for his symptoms.  Review of Systems  Past Medical History:  Diagnosis Date  . Essential hypertension     Current Outpatient Medications  Medication Sig Dispense Refill  . amLODipine (NORVASC) 10 MG tablet Take 1 tablet (10 mg total) by mouth daily. 90 tablet 3  . hydrochlorothiazide (MICROZIDE) 12.5 MG capsule Take 1 capsule (12.5 mg total) by mouth daily. 90 capsule 0  . tamsulosin (FLOMAX) 0.4 MG CAPS capsule Take 1 capsule (0.4 mg total) by mouth daily. 90 capsule 1   No current facility-administered medications for this visit.     Allergies  Allergen Reactions  . Ace Inhibitors Swelling    Family History  Problem Relation Age of Onset  . Cancer Father        Unsure  . Ovarian cancer Mother   . Congestive Heart Failure Brother   . Hypertension Brother     Social History   Socioeconomic History  . Marital status: Married    Spouse name: Not on file  . Number of children: Not on file  . Years of education: Not on file  . Highest education level: Not on file  Occupational History  . Not on file  Social Needs  . Financial resource strain: Not on file  . Food insecurity:    Worry: Not on file    Inability: Not on file  . Transportation needs:    Medical: Not on file    Non-medical: Not on file    Tobacco Use  . Smoking status: Former Games developer  . Smokeless tobacco: Never Used  . Tobacco comment: avg 2 cigs/day  Substance and Sexual Activity  . Alcohol use: No    Alcohol/week: 0.0 oz  . Drug use: No  . Sexual activity: Not on file  Lifestyle  . Physical activity:    Days per week: Not on file    Minutes per session: Not on file  . Stress: Not on file  Relationships  . Social connections:    Talks on phone: Not on file    Gets together: Not on file    Attends religious service: Not on file    Active member of club or organization: Not on file    Attends meetings of clubs or organizations: Not on file    Relationship status: Not on file  . Intimate partner violence:    Fear of current or ex partner: Not on file    Emotionally abused: Not on file    Physically abused: Not on file    Forced sexual activity: Not on file  Other Topics Concern  . Not on file  Social History Narrative   Married.   4 children.   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   Enjoys doing comedy.      Constitutional: Denies fever, malaise, fatigue, headache or abrupt weight changes.  Respiratory: Denies difficulty breathing, shortness of breath, cough or sputum production.   Cardiovascular: Denies chest pain, chest tightness, palpitations or swelling in the hands or feet.  Musculoskeletal: Pt reports neck pain and tailbone pain. Denies difficulty with gait, or joint swelling.  Skin: Denies redness, rashes, lesions or ulcercations.  Neurological: Denies dizziness, difficulty with memory, difficulty with speech or problems with balance and coordination.    No other specific complaints in a complete review of systems (except as listed in HPI above).     Objective:   Physical Exam  BP (!) 152/90   Pulse 72   Temp 97.8 F (36.6 C) (Oral)   Wt 235 lb 4 oz (106.7 kg)   SpO2 97%   BMI 30.20 kg/m  Wt Readings from Last 3 Encounters:  10/02/17 235 lb 4 oz (106.7 kg)  04/06/17 225  lb (102.1 kg)  03/15/17 231 lb 6.4 oz (105 kg)    General: Appears his stated age, well developed, well nourished in NAD. Musculoskeletal: Normal flexion, extension and rotation to the left of the cervical spine. Pain with rotation to the right. No bony tenderness noted over the cervical spine. Pain with palpation of the right paracervical muscles.  Neurological: Alert and oriented.    BMET    Component Value Date/Time   NA 141 03/15/2017 0805   NA 143 11/02/2013 1330   K 4.1 03/15/2017 0805   K 4.0 11/02/2013 1330   CL 106 03/15/2017 0805   CL 111 (H) 11/02/2013 1330   CO2 29 03/15/2017 0805   CO2 26 11/02/2013 1330   GLUCOSE 102 (H) 03/15/2017 0805   GLUCOSE 98 11/02/2013 1330   BUN 16 03/15/2017 0805   BUN 14 11/02/2013 1330   CREATININE 1.03 03/15/2017 0805   CREATININE 1.05 07/02/2015 1532   CALCIUM 9.1 03/15/2017 0805   CALCIUM 8.5 11/02/2013 1330   GFRNONAA >60 10/17/2016 2303   GFRNONAA >60 11/02/2013 1330   GFRAA >60 10/17/2016 2303   GFRAA >60 11/02/2013 1330    Lipid Panel     Component Value Date/Time   CHOL 176 03/15/2017 0805   TRIG 78.0 03/15/2017 0805   HDL 42.60 03/15/2017 0805   CHOLHDL 4 03/15/2017 0805   VLDL 15.6 03/15/2017 0805   LDLCALC 118 (H) 03/15/2017 0805    CBC    Component Value Date/Time   WBC 6.9 10/17/2016 2303   RBC 4.24 (L) 10/17/2016 2303   HGB 13.3 10/17/2016 2303   HGB 12.7 (L) 11/02/2013 1330   HCT 38.8 (L) 10/17/2016 2303   HCT 39.3 (L) 11/02/2013 1330   PLT 248 10/17/2016 2303   PLT 184 11/02/2013 1330   MCV 91.5 10/17/2016 2303   MCV 94 11/02/2013 1330   MCH 31.3 10/17/2016 2303   MCHC 34.2 10/17/2016 2303   RDW 12.9 10/17/2016 2303   RDW 12.6 11/02/2013 1330   LYMPHSABS 3.1 10/17/2016 2303   LYMPHSABS 2.2 11/02/2013 1330   MONOABS 0.6 10/17/2016 2303   MONOABS 0.4 11/02/2013 1330   EOSABS 0.3 10/17/2016 2303   EOSABS 0.1 11/02/2013 1330   BASOSABS 0.1 10/17/2016 2303   BASOSABS 0.1 11/02/2013 1330     Hgb A1C Lab Results  Component Value Date   HGBA1C 5.3 07/02/2015            Assessment & Plan:   Acute Right  Sided Neck Pain:  Likely just a strain Encouraged heat, massage eRx for Naproxen 500 mg BID with food eRx for Norflex 100 mg BID- sedation caution given  Tailbone Pain:  Will obtain xray of sacrum/coccyx today  Will follow up after xray, return precautions discussed Nicki Reaper, NP

## 2017-10-04 ENCOUNTER — Encounter: Payer: Self-pay | Admitting: Internal Medicine

## 2017-10-05 ENCOUNTER — Other Ambulatory Visit: Payer: Self-pay | Admitting: Primary Care

## 2017-10-05 ENCOUNTER — Other Ambulatory Visit: Payer: Self-pay

## 2017-10-05 ENCOUNTER — Encounter: Payer: Self-pay | Admitting: Emergency Medicine

## 2017-10-05 ENCOUNTER — Emergency Department
Admission: EM | Admit: 2017-10-05 | Discharge: 2017-10-05 | Disposition: A | Discharge status: Home / Self Care | Payer: Self-pay | Attending: Emergency Medicine | Admitting: Emergency Medicine

## 2017-10-05 DIAGNOSIS — Z87891 Personal history of nicotine dependence: Secondary | ICD-10-CM | POA: Insufficient documentation

## 2017-10-05 DIAGNOSIS — I10 Essential (primary) hypertension: Secondary | ICD-10-CM | POA: Insufficient documentation

## 2017-10-05 DIAGNOSIS — K047 Periapical abscess without sinus: Secondary | ICD-10-CM | POA: Insufficient documentation

## 2017-10-05 DIAGNOSIS — Z79899 Other long term (current) drug therapy: Secondary | ICD-10-CM | POA: Insufficient documentation

## 2017-10-05 MED ORDER — IBUPROFEN 800 MG PO TABS
800.0000 mg | ORAL_TABLET | Freq: Once | ORAL | Status: AC
  Administered 2017-10-05: 800 mg via ORAL
  Filled 2017-10-05: qty 1

## 2017-10-05 MED ORDER — IBUPROFEN 800 MG PO TABS: 800.0000 mg | ORAL_TABLET | Freq: Three times a day (TID) | ORAL | 0 refills | Status: DC | PRN

## 2017-10-05 MED ORDER — AMOXICILLIN 500 MG PO CAPS: 500.0000 mg | ORAL_CAPSULE | Freq: Three times a day (TID) | ORAL | 0 refills | Status: DC

## 2017-10-05 MED ORDER — LIDOCAINE VISCOUS HCL 2 % MT SOLN
15.0000 mL | Freq: Once | OROMUCOSAL | Status: AC
  Administered 2017-10-05: 15 mL via OROMUCOSAL
  Filled 2017-10-05: qty 15

## 2017-10-05 MED ORDER — HYDROCODONE-ACETAMINOPHEN 5-325 MG PO TABS: 1.0000 | ORAL_TABLET | Freq: Four times a day (QID) | ORAL | 0 refills | Status: DC | PRN

## 2017-10-05 MED ORDER — AMOXICILLIN 500 MG PO CAPS
500.0000 mg | ORAL_CAPSULE | Freq: Once | ORAL | Status: AC
  Administered 2017-10-05: 500 mg via ORAL
  Filled 2017-10-05: qty 1

## 2017-10-05 NOTE — Discharge Instructions (Addendum)
Take antibiotic as prescribed (Amoxicillin 500mg  three times daily x 7 days). You may take pain medicines as needed (Motrin/Norco #15). Return to the ER for worsening symptoms, persistent vomiting, difficulty breathing or other concerns.

## 2017-10-05 NOTE — ED Provider Notes (Signed)
[facility] Emergency Department Provider Note   ____________________________________________   First MD Initiated Contact with Patient 10/05/17 0256     (approximate)  I have reviewed the triage vital signs and the nursing notes.   HISTORY  Chief Complaint Dental Pain    HPI Wesley Ward is a 57 y.o. male who presents to the ED from home with a chief complaint of dental pain.  Patient reports pain to the bottom left side of his mouth which started a few days ago.  Presents tonight because he noted swelling.  Denies fever, chills, chest pain, shortness of breath, abdominal pain, nausea or vomiting.  Denies recent travel or trauma.   Past Medical History:  Diagnosis Date  . Essential hypertension     Patient Active Problem List   Diagnosis Date Noted  . Preventative health care 07/04/2015  . Difficulty urinating 05/28/2015  . Essential hypertension 03/16/2015    Past Surgical History:  Procedure Laterality Date  . OTHER SURGICAL HISTORY     testiuclar surgery  . SURGERY SCROTAL / TESTICULAR      Prior to Admission medications   Medication Sig Start Date End Date Taking? Authorizing Provider  amLODipine (NORVASC) 10 MG tablet Take 1 tablet (10 mg total) by mouth daily. 03/15/17   Doreene Nest, NP  amoxicillin (AMOXIL) 500 MG capsule Take 1 capsule (500 mg total) by mouth 3 (three) times daily. 10/05/17   Irean Hong, MD  hydrochlorothiazide (MICROZIDE) 12.5 MG capsule Take 1 capsule (12.5 mg total) by mouth daily. 04/06/17   Doreene Nest, NP  HYDROcodone-acetaminophen (NORCO) 5-325 MG tablet Take 1 tablet by mouth every 6 (six) hours as needed for moderate pain. 10/05/17   Irean Hong, MD  ibuprofen (ADVIL,MOTRIN) 800 MG tablet Take 1 tablet (800 mg total) by mouth every 8 (eight) hours as needed for moderate pain. 10/05/17   Irean Hong, MD  naproxen (NAPROSYN) 375 MG tablet Take 1 tablet (375 mg total) by mouth 2 (two) times  daily with a meal. 10/02/17   Baity, Salvadore Oxford, NP  orphenadrine (NORFLEX) 100 MG tablet Take 1 tablet (100 mg total) by mouth 2 (two) times daily. 10/02/17   Lorre Munroe, NP  tamsulosin (FLOMAX) 0.4 MG CAPS capsule Take 1 capsule (0.4 mg total) by mouth daily. 03/15/17   Doreene Nest, NP    Allergies Ace inhibitors  Family History  Problem Relation Age of Onset  . Cancer Father        Unsure  . Ovarian cancer Mother   . Congestive Heart Failure Brother   . Hypertension Brother     Social History Social History   Tobacco Use  . Smoking status: Former Games developer  . Smokeless tobacco: Never Used  . Tobacco comment: avg 2 cigs/day  Substance Use Topics  . Alcohol use: No    Alcohol/week: 0.0 oz  . Drug use: No    Review of Systems  Constitutional: No fever/chills Eyes: No visual changes. ENT: Positive for dental pain. No sore throat. Cardiovascular: Denies chest pain. Respiratory: Denies shortness of breath. Gastrointestinal: No abdominal pain.  No nausea, no vomiting.  No diarrhea.  No constipation. Genitourinary: Negative for dysuria. Musculoskeletal: Negative for back pain. Skin: Negative for rash. Neurological: Negative for headaches, focal weakness or numbness.   ____________________________________________   PHYSICAL EXAM:  VITAL SIGNS: ED Triage Vitals  Enc Vitals Group     BP 10/05/17 0236 (!) 180/102  Pulse Rate 10/05/17 0236 88     Resp 10/05/17 0236 16     Temp 10/05/17 0236 98.3 F (36.8 C)     Temp Source 10/05/17 0236 Oral     SpO2 10/05/17 0236 94 %     Weight 10/05/17 0233 235 lb (106.6 kg)     Height 10/05/17 0233 6\' 2"  (1.88 m)     Head Circumference --      Peak Flow --      Pain Score 10/05/17 0233 7     Pain Loc --      Pain Edu? --      Excl. in GC? --     Constitutional: Alert and oriented. Well appearing and in no acute distress. Eyes: Conjunctivae are normal. PERRL. EOMI. Head: Atraumatic. Nose: No  congestion/rhinnorhea. Mouth/Throat: Mucous membranes are moist.  Wide spread dental decay.  Abscessed left lower molar. Neck: No stridor.   Cardiovascular: Normal rate, regular rhythm. Grossly normal heart sounds.  Good peripheral circulation. Respiratory: Normal respiratory effort.  No retractions. Lungs CTAB. Gastrointestinal: Soft and nontender. No distention. No abdominal bruits. No CVA tenderness. Musculoskeletal: No lower extremity tenderness nor edema.  No joint effusions. Neurologic:  Normal speech and language. No gross focal neurologic deficits are appreciated. No gait instability. Skin:  Skin is warm, dry and intact. No rash noted. Psychiatric: Mood and affect are normal. Speech and behavior are normal.  ____________________________________________   LABS (all labs ordered are listed, but only abnormal results are displayed)  Labs Reviewed - No data to display ____________________________________________  EKG  None ____________________________________________  RADIOLOGY  ED MD interpretation: None  Official radiology report(s): No results found.  ____________________________________________   PROCEDURES  Procedure(s) performed: None  Procedures  Critical Care performed: No  ____________________________________________   INITIAL IMPRESSION / ASSESSMENT AND PLAN / ED COURSE  As part of my medical decision making, I reviewed the following data within the electronic MEDICAL RECORD NUMBER Nursing notes reviewed and incorporated and Notes from prior ED visits   57 year old male who presents with dentalgia secondary to dental abscess.  Will start on amoxicillin, NSAIDs, analgesia patient will follow-up with a dentist.  Strict return precautions given.  Patient verbalizes understanding and agrees with plan of care.      ____________________________________________   FINAL CLINICAL IMPRESSION(S) / ED DIAGNOSES  Final diagnoses:  Dental abscess     ED  Discharge Orders        Ordered    amoxicillin (AMOXIL) 500 MG capsule  3 times daily     10/05/17 0331    ibuprofen (ADVIL,MOTRIN) 800 MG tablet  Every 8 hours PRN     10/05/17 0331    HYDROcodone-acetaminophen (NORCO) 5-325 MG tablet  Every 6 hours PRN     10/05/17 0331       Note:  This document was prepared using Dragon voice recognition software and may include unintentional dictation errors.    Irean HongSung, Jade J, MD 10/05/17 [phone]

## 2017-10-05 NOTE — ED Notes (Addendum)
Patient c/o left upper mouth pain X 3 days. Broken tooth at site of pain. Patient reports hx of multiple dental abscesses. Patient reports he has to have all of his teeth pulled, however, has just switched dental providers/having insurance issues. Patient denies taking anything for pain.

## 2017-10-05 NOTE — ED Notes (Signed)
Reviewed discharge instructions, follow-up care, and prescriptions with patient. Patient verbalized understanding of all information reviewed. Patient stable, with no distress noted at this time.    

## 2017-10-05 NOTE — ED Triage Notes (Signed)
Pt presents to ED with possible abscessed tooth on the bottom left side of his mouth. Pt states pain started a few days ago; noticed swelling this evening. Denies fever.

## 2017-11-01 ENCOUNTER — Encounter: Payer: Self-pay | Admitting: *Deleted

## 2017-11-01 ENCOUNTER — Other Ambulatory Visit: Payer: Self-pay

## 2017-11-01 ENCOUNTER — Emergency Department: Payer: Self-pay

## 2017-11-01 ENCOUNTER — Emergency Department
Admission: EM | Admit: 2017-11-01 | Discharge: 2017-11-01 | Disposition: A | Discharge status: Home / Self Care | Payer: Self-pay | Attending: Emergency Medicine | Admitting: Emergency Medicine

## 2017-11-01 DIAGNOSIS — Z87891 Personal history of nicotine dependence: Secondary | ICD-10-CM | POA: Insufficient documentation

## 2017-11-01 DIAGNOSIS — I10 Essential (primary) hypertension: Secondary | ICD-10-CM | POA: Insufficient documentation

## 2017-11-01 DIAGNOSIS — R0789 Other chest pain: Secondary | ICD-10-CM | POA: Insufficient documentation

## 2017-11-01 DIAGNOSIS — Z79899 Other long term (current) drug therapy: Secondary | ICD-10-CM | POA: Insufficient documentation

## 2017-11-01 LAB — BASIC METABOLIC PANEL
ANION GAP: 7 (ref 5–15)
BUN: 20 mg/dL (ref 6–20)
CHLORIDE: 106 mmol/L (ref 98–111)
CO2: 28 mmol/L (ref 22–32)
CREATININE: 1.28 mg/dL — AB (ref 0.61–1.24)
Calcium: 9.2 mg/dL (ref 8.9–10.3)
GFR calc non Af Amer: >60 mL/min (ref >60)
GLUCOSE: 177 mg/dL — AB (ref 70–99)
Potassium: 4 mmol/L (ref 3.5–5.1)
Sodium: 141 mmol/L (ref 135–145)

## 2017-11-01 LAB — TROPONIN I
Troponin I: <0.03 ng/mL (ref <0.03)
Troponin I: <0.03 ng/mL (ref <0.03)

## 2017-11-01 LAB — CBC
HCT: 36.4 % — ABNORMAL LOW (ref 40.0–52.0)
HEMOGLOBIN: 12.4 g/dL — AB (ref 13.0–18.0)
MCH: 31.6 pg (ref 26.0–34.0)
MCHC: 34.1 g/dL (ref 32.0–36.0)
MCV: 92.7 fL (ref 80.0–100.0)
Platelets: 241 10*3/uL (ref 150–440)
RBC: 3.92 MIL/uL — AB (ref 4.40–5.90)
RDW: 13.1 % (ref 11.5–14.5)
WBC: 5.9 10*3/uL (ref 3.8–10.6)

## 2017-11-01 NOTE — ED Triage Notes (Signed)
Pt to triage via wheelchair.  Pt has chest pain for 1 hour.  Radiates into left arm.  Pt denies sob.  cig smoker.  No n/v/d  Pt alert  Speech clear.

## 2017-11-01 NOTE — ED Provider Notes (Signed)
[facility] Provider Note ____________________________________________   First MD Initiated Contact with Patient 11/01/17 2029     (approximate)  I have reviewed the triage vital signs and the nursing notes.   HISTORY  Chief Complaint Chest Pain    HPI BENITO LEMMERMAN is a 57 y.o. male with PMH as noted below who presents with chest pain, acute onset at approximately 5 PM today, described as pressure-like, with some radiation to the left arm described as tingling.  Patient states that it happened soon after he drank a diet soda very quickly.  He states that it started when he was coming out of the shower, and it has been nonexertional.  The patient is gradually subsided since then and is now minimal.  He denies associated shortness of breath or lightheadedness.  No prior history of similar symptoms.  The patient has no prior cardiac history.  Past Medical History:  Diagnosis Date  . Essential hypertension     Patient Active Problem List   Diagnosis Date Noted  . Preventative health care 07/04/2015  . Difficulty urinating 05/28/2015  . Essential hypertension 03/16/2015    Past Surgical History:  Procedure Laterality Date  . OTHER SURGICAL HISTORY     testiuclar surgery  . SURGERY SCROTAL / TESTICULAR      Prior to Admission medications   Medication Sig Start Date End Date Taking? Authorizing Provider  amLODipine (NORVASC) 10 MG tablet Take 1 tablet (10 mg total) by mouth daily. 03/15/17   Doreene Nest, NP  amoxicillin (AMOXIL) 500 MG capsule Take 1 capsule (500 mg total) by mouth 3 (three) times daily. 10/05/17   Irean Hong, MD  hydrochlorothiazide (MICROZIDE) 12.5 MG capsule TAKE 1 CAPSULE BY MOUTH DAILY 10/05/17   Doreene Nest, NP  HYDROcodone-acetaminophen (NORCO) 5-325 MG tablet Take 1 tablet by mouth every 6 (six) hours as needed for moderate pain. 10/05/17   Irean Hong, MD  ibuprofen (ADVIL,MOTRIN) 800 MG  tablet Take 1 tablet (800 mg total) by mouth every 8 (eight) hours as needed for moderate pain. 10/05/17   Irean Hong, MD  naproxen (NAPROSYN) 375 MG tablet Take 1 tablet (375 mg total) by mouth 2 (two) times daily with a meal. 10/02/17   Baity, Salvadore Oxford, NP  orphenadrine (NORFLEX) 100 MG tablet Take 1 tablet (100 mg total) by mouth 2 (two) times daily. 10/02/17   Lorre Munroe, NP  tamsulosin (FLOMAX) 0.4 MG CAPS capsule Take 1 capsule (0.4 mg total) by mouth daily. 03/15/17   Doreene Nest, NP    Allergies Ace inhibitors  Family History  Problem Relation Age of Onset  . Cancer Father        Unsure  . Ovarian cancer Mother   . Congestive Heart Failure Brother   . Hypertension Brother     Social History Social History   Tobacco Use  . Smoking status: Former Games developer  . Smokeless tobacco: Never Used  . Tobacco comment: avg 2 cigs/day  Substance Use Topics  . Alcohol use: No    Alcohol/week: 0.0 standard drinks  . Drug use: No    Review of Systems  Constitutional: No fever. Eyes: No redness. ENT: No neck pain. Cardiovascular: Positive for chest pain. Respiratory: Denies shortness of breath. Gastrointestinal: No vomiting.  Genitourinary: Negative for flank pain.  Musculoskeletal: Negative for back pain. Skin: Negative for rash. Neurological: Negative for headache.   ____________________________________________   PHYSICAL EXAM:  VITAL SIGNS:  ED Triage Vitals  Enc Vitals Group     BP 11/01/17 1839 (!) 156/99     Pulse Rate 11/01/17 1839 90     Resp 11/01/17 1839 18     Temp 11/01/17 1839 98.6 F (37 C)     Temp Source 11/01/17 1839 Oral     SpO2 11/01/17 1839 99 %     Weight 11/01/17 1840 236 lb (107 kg)     Height 11/01/17 1840 6\' 2"  (1.88 m)     Head Circumference --      Peak Flow --      Pain Score 11/01/17 1840 3     Pain Loc --      Pain Edu? --      Excl. in GC? --     Constitutional: Alert and oriented. Well appearing and in no acute  distress. Eyes: Conjunctivae are normal.  Head: Atraumatic. Nose: No congestion/rhinnorhea. Mouth/Throat: Mucous membranes are moist.   Neck: Normal range of motion.  Cardiovascular: Normal rate, regular rhythm. Grossly normal heart sounds.  Good peripheral circulation. Respiratory: Normal respiratory effort.  No retractions. Lungs CTAB. Gastrointestinal: No distention.  Musculoskeletal:  Extremities warm and well perfused.  Neurologic:  Normal speech and language. No gross focal neurologic deficits are appreciated.  Skin:  Skin is warm and dry. No rash noted. Psychiatric: Mood and affect are normal. Speech and behavior are normal.  ____________________________________________   LABS (all labs ordered are listed, but only abnormal results are displayed)  Labs Reviewed  BASIC METABOLIC PANEL - Abnormal; Notable for the following components:      Result Value   Glucose, Bld 177 (*)    Creatinine, Ser 1.28 (*)    All other components within normal limits  CBC - Abnormal; Notable for the following components:   RBC 3.92 (*)    Hemoglobin 12.4 (*)    HCT 36.4 (*)    All other components within normal limits  TROPONIN I  TROPONIN I   ____________________________________________  EKG  ED ECG REPORT I, Dionne Bucy, the attending physician, personally viewed and interpreted this ECG.  Date: 11/01/2017 EKG Time: 1837 Rate: 90 Rhythm: normal sinus rhythm QRS Axis: normal Intervals: Prolonged PR ST/T Wave abnormalities: normal Narrative Interpretation: no evidence of acute ischemia; no significant change when compared to EKG of 04/06/2017  ____________________________________________  RADIOLOGY  CXR: No focal infiltrate or edema  ____________________________________________   PROCEDURES  Procedure(s) performed: No  Procedures  Critical Care performed: No ____________________________________________   INITIAL IMPRESSION / ASSESSMENT AND PLAN / ED  COURSE  Pertinent labs & imaging results that were available during my care of the patient were reviewed by me and considered in my medical decision making (see chart for details).  57 year old male with a history of hypertension presents with atypical chest pain, acute onset around 530 today and occurring after the patient quickly drank a soda.  It is nonexertional, and has no concerning associated symptoms.  I reviewed the past medical records in Epic; the patient was most recently seen in the ED last month for unrelated symptoms.  He has no prior cardiac history and no recent prior cardiac work-ups.  On exam, he is well-appearing.  Vital signs are normal except for hypertension.  The remainder of the exam is unremarkable.  Overall presentation is most consistent with GERD or gastritis.  It also may be benign musculoskeletal related pain.  I have a low suspicion for ACS given the nonexertional nature of the symptoms, the  reassuring EKG, and the patient's lack of risk factors other than hypertension.  He has no clinical evidence for DVT, and I do not suspect PE especially as the symptoms have been improving on their own and he has no tachycardia or hypoxia.  There is no clinical evidence for aortic dissection or other vascular etiology.  Initial labs and first troponin are negative.  We will obtain a second troponin approximately 4 hours after the start of the pain and reassess.  I anticipate that if the patient continues to feel well and the troponin is negative, that he will be able to go home.  ----------------------------------------- 10:27 PM on 11/01/2017 -----------------------------------------  Repeat troponin is negative.  The pain is fully resolved.  The patient is ambulating and appears comfortable.  He would like to go home.  I counseled him on the results of the work-up.  Return precautions given, and he expresses understanding.  I instructed him to follow-up with his PMD and he  agrees with this plan. ____________________________________________   FINAL CLINICAL IMPRESSION(S) / ED DIAGNOSES  Final diagnoses:  Atypical chest pain      NEW MEDICATIONS STARTED DURING THIS VISIT:  New Prescriptions   No medications on file     Note:  This document was prepared using Dragon voice recognition software and may include unintentional dictation errors.    Dionne BucySiadecki, Dirck Butch, MD 11/01/17 2228

## 2017-11-01 NOTE — Discharge Instructions (Addendum)
Your ER work-up today was negative.  Follow-up with your regular doctor.  Return to the ER for new, worsening, or persistent severe chest pain, difficulty breathing, weakness or lightheadedness, or any other new or worsening symptoms that concern you.

## 2017-11-09 ENCOUNTER — Other Ambulatory Visit: Payer: Self-pay | Admitting: Primary Care

## 2017-11-09 DIAGNOSIS — I10 Essential (primary) hypertension: Secondary | ICD-10-CM

## 2017-11-10 ENCOUNTER — Other Ambulatory Visit: Payer: Self-pay | Admitting: Primary Care

## 2017-11-10 DIAGNOSIS — I10 Essential (primary) hypertension: Secondary | ICD-10-CM

## 2018-02-20 ENCOUNTER — Other Ambulatory Visit: Payer: Self-pay | Admitting: Primary Care

## 2018-02-20 DIAGNOSIS — I10 Essential (primary) hypertension: Secondary | ICD-10-CM

## 2018-02-20 MED ORDER — HYDROCHLOROTHIAZIDE 12.5 MG PO CAPS: 12.5000 mg | ORAL_CAPSULE | Freq: Every day | ORAL | 0 refills | Status: DC

## 2018-05-13 ENCOUNTER — Ambulatory Visit (INDEPENDENT_AMBULATORY_CARE_PROVIDER_SITE_OTHER)
Admission: RE | Admit: 2018-05-13 | Discharge: 2018-05-13 | Disposition: A | Discharge status: Home / Self Care | Payer: Self-pay | Source: Ambulatory Visit | Attending: Primary Care | Admitting: Primary Care

## 2018-05-13 ENCOUNTER — Ambulatory Visit (INDEPENDENT_AMBULATORY_CARE_PROVIDER_SITE_OTHER): Payer: Self-pay | Admitting: Primary Care

## 2018-05-13 ENCOUNTER — Encounter: Payer: Self-pay | Admitting: Primary Care

## 2018-05-13 DIAGNOSIS — K047 Periapical abscess without sinus: Secondary | ICD-10-CM

## 2018-05-13 DIAGNOSIS — M25511 Pain in right shoulder: Secondary | ICD-10-CM

## 2018-05-13 DIAGNOSIS — I10 Essential (primary) hypertension: Secondary | ICD-10-CM

## 2018-05-13 DIAGNOSIS — R39198 Other difficulties with micturition: Secondary | ICD-10-CM

## 2018-05-13 DIAGNOSIS — G8929 Other chronic pain: Secondary | ICD-10-CM

## 2018-05-13 DIAGNOSIS — M25519 Pain in unspecified shoulder: Secondary | ICD-10-CM

## 2018-05-13 DIAGNOSIS — G479 Sleep disorder, unspecified: Secondary | ICD-10-CM

## 2018-05-13 LAB — COMPREHENSIVE METABOLIC PANEL
ALBUMIN: 4.1 g/dL (ref 3.5–5.2)
ALT: 19 U/L (ref 0–53)
AST: 18 U/L (ref 0–37)
Alkaline Phosphatase: 67 U/L (ref 39–117)
BUN: 14 mg/dL (ref 6–23)
CALCIUM: 8.9 mg/dL (ref 8.4–10.5)
CHLORIDE: 107 meq/L (ref 96–112)
CO2: 26 meq/L (ref 19–32)
CREATININE: 0.91 mg/dL (ref 0.40–1.50)
GFR: 104.14 mL/min (ref >60.00)
Glucose, Bld: 101 mg/dL — ABNORMAL HIGH (ref 70–99)
Potassium: 4 mEq/L (ref 3.5–5.1)
Sodium: 140 mEq/L (ref 135–145)
Total Bilirubin: 0.6 mg/dL (ref 0.2–1.2)
Total Protein: 7.2 g/dL (ref 6.0–8.3)

## 2018-05-13 MED ORDER — AMOXICILLIN 875 MG PO TABS: 875.0000 mg | ORAL_TABLET | Freq: Two times a day (BID) | ORAL | 0 refills | Status: DC

## 2018-05-13 MED ORDER — TAMSULOSIN HCL 0.4 MG PO CAPS: 0.4000 mg | ORAL_CAPSULE | Freq: Every day | ORAL | 3 refills | Status: DC

## 2018-05-13 MED ORDER — TRAZODONE HCL 50 MG PO TABS: 25.0000 mg | ORAL_TABLET | Freq: Every evening | ORAL | 0 refills | Status: DC | PRN

## 2018-05-13 MED ORDER — HYDROCHLOROTHIAZIDE 12.5 MG PO CAPS: 12.5000 mg | ORAL_CAPSULE | Freq: Every day | ORAL | 3 refills | Status: DC

## 2018-05-13 MED ORDER — AMLODIPINE BESYLATE 10 MG PO TABS: 10.0000 mg | ORAL_TABLET | Freq: Every day | ORAL | 3 refills | Status: DC

## 2018-05-13 NOTE — Assessment & Plan Note (Signed)
Chronic, sleeping 2-4 hours nightly. Has failed numerous OTC medications. Rx for low dose Trazodone sent to pharmacy, he will update.

## 2018-05-13 NOTE — Assessment & Plan Note (Signed)
Chronic for months, decrease in ROM with pain on exam today. HPI and exam consistent for bursitis but will have Sports Medicine evaluate. May need cortisone injection. Xrays pending.

## 2018-05-13 NOTE — Patient Instructions (Addendum)
Stop by the lab and xray prior to leaving today. I will notify you of your results once received.   I sent refills of your blood pressure medication to [facility].  You can try the Trazodone tablets for sleep. Take 1/2-1 tablet by mouth at bedtime as needed for sleep.  Update me in a few weeks.   Start amoxicillin antibiotics for the dental infection. Take 1 tablet by mouth twice daily for 7 days.  Schedule an appointment with Dr. Patsy Lager for your shoulder.  Please call me if your blood pressure runs at or above 135/90.   It was a pleasure to see you today!

## 2018-05-13 NOTE — Assessment & Plan Note (Signed)
Chronic, recurrent. Working on getting in with dentist. Evident of active abscess today. Rx for Amoxil course sent to pharmacy.

## 2018-05-13 NOTE — Assessment & Plan Note (Signed)
Did well on Tamsulosin, continue same. Refills sent to pharmacy.

## 2018-05-13 NOTE — Assessment & Plan Note (Signed)
Out of meds for several months, BP above goal today. Refills provided for both, he will update if BP runs at or above 135/90.

## 2018-05-13 NOTE — Progress Notes (Signed)
Subjective:    Patient ID: Wesley Ward, male    DOB: June 08, 1960, 58 y.o.   MRN: [id number]  HPI  Wesley Ward is a 58 year old male who presents today for follow up and multiple issues.   1) Hypertension: He is currently managed on amlodipine 10 mg and HCTZ 12.5 mg. He was last evaluated in late January 2020 with elevated readings on Amlodipine 10 mg so HCTZ 12.5 mg was added. He was asked to follow up 2-3 weeks later.  Since his last visit he's run out of his BP medication 1-2 months ago. He endorses "normal" readings when on both medications.   BP Readings from Last 3 Encounters:  05/13/18 (!) 152/100  11/01/17 (!) 162/109  10/05/17 (!) 171/109   2) Difficulty Urinating: Currently managed on tamsulosin 0.4 mg capsules. He was doing well on tamsulosin and is completely out of refills.   3) Chronic Shoulder Pain: Located to the right shoulder which began 2-3 months ago. Denies numbness/tingling, trauma. He's tried OTC medication, hot/cold compresses without improvement. Pain is worse with certain ranges of motion.   4) Sleep Disturbance: Present for months. Goes to bed around 10pm-12am, wakes at 2am-3am. He's tried OTC Tylenol PM, Melatonin, Nyquil without improvement. When he wakes he will be up for the rest of the day. His wife endorses snoring, denies waking up gasping for air and denies sleep apnea. He occasionally feels tired during the day, mostly overall good.   5) Dental Abscess: Chronic for years. He is working on getting into a Education officer, community but cannot be seen for several weeks. He has a current infection to the upper teeth and is requesting antibiotics until seen. He denies fevers, chills.   Review of Systems  HENT:       Dental abscesses   Respiratory: Negative for shortness of breath.   Cardiovascular: Negative for chest pain.  Musculoskeletal: Positive for arthralgias.  Neurological: Negative for dizziness and headaches.  Psychiatric/Behavioral: Positive for sleep  disturbance. The patient is not nervous/anxious.        Past Medical History:  Diagnosis Date  . Essential hypertension      Social History   Socioeconomic History  . Marital status: Married    Spouse name: Not on file  . Number of children: Not on file  . Years of education: Not on file  . Highest education level: Not on file  Occupational History  . Not on file  Social Needs  . Financial resource strain: Not on file  . Food insecurity:    Worry: Not on file    Inability: Not on file  . Transportation needs:    Medical: Not on file    Non-medical: Not on file  Tobacco Use  . Smoking status: Former Games developer  . Smokeless tobacco: Never Used  . Tobacco comment: avg 2 cigs/day  Substance and Sexual Activity  . Alcohol use: No    Alcohol/week: 0.0 standard drinks  . Drug use: No  . Sexual activity: Not on file  Lifestyle  . Physical activity:    Days per week: Not on file    Minutes per session: Not on file  . Stress: Not on file  Relationships  . Social connections:    Talks on phone: Not on file    Gets together: Not on file    Attends religious service: Not on file    Active member of club or organization: Not on file    Attends meetings of clubs  or organizations: Not on file    Relationship status: Not on file  . Intimate partner violence:    Fear of current or ex partner: Not on file    Emotionally abused: Not on file    Physically abused: Not on file    Forced sexual activity: Not on file  Other Topics Concern  . Not on file  Social History Narrative   Married.   4 children.   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   Enjoys doing comedy.     Past Surgical History:  Procedure Laterality Date  . OTHER SURGICAL HISTORY     testiuclar surgery  . SURGERY SCROTAL / TESTICULAR      Family History  Problem Relation Age of Onset  . Cancer Father        Unsure  . Ovarian cancer Mother   . Congestive Heart Failure Brother   . Hypertension  Brother     Allergies  Allergen Reactions  . Ace Inhibitors Swelling    Current Outpatient Medications on File Prior to Visit  Medication Sig Dispense Refill  . amLODipine (NORVASC) 10 MG tablet Take 1 tablet (10 mg total) by mouth daily. 90 tablet 3  . hydrochlorothiazide (MICROZIDE) 12.5 MG capsule Take 1 capsule (12.5 mg total) by mouth daily. NEED APPOINTMENT FOR ANY MORE REFILLS 30 capsule 0  . tamsulosin (FLOMAX) 0.4 MG CAPS capsule Take 1 capsule (0.4 mg total) by mouth daily. 90 capsule 1   No current facility-administered medications on file prior to visit.     BP (!) 152/100   Pulse 74   Temp 98 F (36.7 C) (Oral)   Ht 6\' 2"  (1.88 m)   Wt 259 lb 12 oz (117.8 kg)   SpO2 95%   BMI 33.35 kg/m    Objective:   Physical Exam  Constitutional: He appears well-nourished.  HENT:  Several abscess like bumps to upper and lower gums. No bleeding.   Neck: Neck supple.  Cardiovascular: Normal rate and regular rhythm.  Respiratory: Effort normal and breath sounds normal.  Musculoskeletal:     Right shoulder: He exhibits decreased range of motion and pain. He exhibits no tenderness.     Comments: Decrease in ROM with pain with abduction in most planes. 5/5 strength to bilateral upper extremities.   Skin: Skin is warm and dry.  Psychiatric: He has a normal mood and affect.           Assessment & Plan:

## 2018-05-20 ENCOUNTER — Encounter: Payer: Self-pay | Admitting: *Deleted

## 2018-07-22 ENCOUNTER — Encounter: Payer: Self-pay | Admitting: Family Medicine

## 2018-07-22 ENCOUNTER — Ambulatory Visit: Payer: Self-pay | Admitting: Family Medicine

## 2018-07-22 ENCOUNTER — Other Ambulatory Visit: Payer: Self-pay

## 2018-07-22 VITALS — BP 130/82 | HR 91 | Temp 97.6°F | Ht 74.0 in | Wt 253.2 lb

## 2018-07-22 DIAGNOSIS — M7501 Adhesive capsulitis of right shoulder: Secondary | ICD-10-CM

## 2018-07-22 NOTE — Progress Notes (Signed)
     Darlyn Repsher T. Zaidy Absher, MD Primary Care and Sports Medicine [facility] at [facility] [address] [place], [address] Phone: [phone]  FAX: [phone]  [name] - 58 y.o. male  MRN [id number]  Date of Birth: 1960/12/22  Visit Date: 07/22/2018  PCP: Doreene Nest, NP  Referred by: Doreene Nest, NP  Chief Complaint  Patient presents with  . Shoulder Pain    Right   Subjective:   Wesley Ward is a 58 y.o. very pleasant male patient who presents with the following:  He is a very nice guy, he has been having some shoulder pain this been worsening over the last 3 or 4 months.  He does not recall any specific injury, he does not have any prior fractures or surgery.  It is limited to the right shoulder, and he is left-hand dominant.  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  He has pain with terminal motion, deep ache in the shoulder that is in a T-shirt distribution, but is strength is preserved.  No numbness or tingling.  No shoulder pain.  Overall, he is very healthy.  Exam: Blood pressure 130/82, pulse 91, temperature 97.6 F (36.4 C), temperature source Oral, height 6\' 2"  (1.88 m), weight 253 lb 4 oz (114.9 kg).   GEN: WDWN, NAD, Non-toxic, Alert & Oriented x 3 HEENT: Atraumatic, Normocephalic.  Ears and Nose: No external deformity. EXTR: No clubbing/cyanosis/edema NEURO: Normal gait.  PSYCH: Normally interactive. Conversant. Not depressed or anxious appearing.  Calm demeanor.    Full range of motion on the left shoulder.  Right shoulder is limited to approximately 125 degrees in abduction, flexion to 135 degrees.  With the shoulder abducted to 90 degrees, he only has about 35 degrees of external rotation and no degrees of internal rotation.  Strength is 5/5.  Neurovascularly intact.  Adhesive capsulitis of right shoulder  Patient was given a systematic ROM  protocol from [employer] to be done daily. Emphasized adherence. Tylenol or NSAID of choice prn for pain relief  Intraarticular corticosteroid injections in Adhesive Capsulitis have clearly been shown to be of benefit.  F/u 2-3 mo if needed, can do then if needed  e1  Signed,  Arik Husmann T. Cheryel Kyte, MD   Outpatient Encounter Medications as of 07/22/2018  Medication Sig  . amLODipine (NORVASC) 10 MG tablet Take 1 tablet (10 mg total) by mouth daily. For blood pressure.  . hydrochlorothiazide (MICROZIDE) 12.5 MG capsule Take 1 capsule (12.5 mg total) by mouth daily. For blood pressure.  . tamsulosin (FLOMAX) 0.4 MG CAPS capsule Take 1 capsule (0.4 mg total) by mouth daily. For urination.  . traZODone (DESYREL) 50 MG tablet Take 0.5-1 tablets (25-50 mg total) by mouth at bedtime as needed for sleep.  . [DISCONTINUED] amoxicillin (AMOXIL) 875 MG tablet Take 1 tablet (875 mg total) by mouth 2 (two) times daily.   No facility-administered encounter medications on file as of 07/22/2018.

## 2018-10-23 ENCOUNTER — Telehealth: Payer: Self-pay | Admitting: Family

## 2018-10-23 DIAGNOSIS — M542 Cervicalgia: Secondary | ICD-10-CM

## 2018-10-23 MED ORDER — BACLOFEN 10 MG PO TABS: 10.0000 mg | ORAL_TABLET | Freq: Three times a day (TID) | ORAL | 0 refills | Status: DC

## 2018-10-23 MED ORDER — NAPROXEN 500 MG PO TABS: 500.0000 mg | ORAL_TABLET | Freq: Two times a day (BID) | ORAL | 0 refills | Status: DC

## 2018-10-23 NOTE — Progress Notes (Signed)
We are sorry that you are not feeling well.  Here is how we plan to help!  Based on what you have shared with me it looks like you mostly have acute neck pain.  Acute neck pain is defined as musculoskeletal pain that can resolve in 1-3 weeks with conservative treatment.  I have prescribed Naprosyn 500 mg twice a day non-steroid anti-inflammatory (NSAID) as well as Baclofen 10 mg every eight hours as needed which is a muscle relaxer  Some patients experience stomach irritation or in increased heartburn with anti-inflammatory drugs.  Please keep in mind that muscle relaxer's can cause fatigue and should not be taken while at work or driving.  Back pain is very common.  The pain often gets better over time.  The cause of back pain is usually not dangerous.  Most people can learn to manage their back pain on their own.   If your pain does not improve or worsens, you will need to be seen face to face.  Approximately 5 minutes was spent documenting and reviewing patient's chart.    Home Care  Stay active.  Start with short walks on flat ground if you can.  Try to walk farther each day.  Do not sit, drive or stand in one place for more than 30 minutes.  Do not stay in bed.  Do not avoid exercise or work.  Activity can help your back heal faster.  Be careful when you bend or lift an object.  Bend at your knees, keep the object close to you, and do not twist.  Sleep on a firm mattress.  Lie on your side, and bend your knees.  If you lie on your back, put a pillow under your knees.  Only take medicines as told by your doctor.  Put ice on the injured area.  Put ice in a plastic bag  Place a towel between your skin and the bag  Leave the ice on for 15-20 minutes, 3-4 times a day for the first 2-3 days. 210 After that, you can switch between ice and heat packs.  Ask your doctor about back exercises or massage.  Avoid feeling anxious or stressed.  Find good ways to deal with stress, such as  exercise.  Get Help Right Way If:  Your pain does not go away with rest or medicine.  Your pain does not go away in 1 week.  You have new problems.  You do not feel well.  The pain spreads into your legs.  You cannot control when you poop (bowel movement) or pee (urinate)  You feel sick to your stomach (nauseous) or throw up (vomit)  You have belly (abdominal) pain.  You feel like you may pass out (faint).  If you develop a fever.  Make Sure you:  Understand these instructions.  Will watch your condition  Will get help right away if you are not doing well or get worse.  Your e-visit answers were reviewed by a board certified advanced clinical practitioner to complete your personal care plan.  Depending on the condition, your plan could have included both over the counter or prescription medications.  If there is a problem please reply  once you have received a response from your provider.  Your safety is important to [place].  If you have drug allergies check your prescription carefully.    You can use MyChart to ask questions about today's visit, request a non-urgent call back, or ask for a work or school  excuse for 24 hours related to this e-Visit. If it has been greater than 24 hours you will need to follow up with your provider, or enter a new e-Visit to address those concerns.  You will get an e-mail in the next two days asking about your experience.  I hope that your e-visit has been valuable and will speed your recovery. Thank you for using e-visits.

## 2019-06-05 ENCOUNTER — Other Ambulatory Visit: Payer: Self-pay | Admitting: Primary Care

## 2019-06-05 DIAGNOSIS — Z1159 Encounter for screening for other viral diseases: Secondary | ICD-10-CM

## 2019-06-05 DIAGNOSIS — Z125 Encounter for screening for malignant neoplasm of prostate: Secondary | ICD-10-CM

## 2019-06-05 DIAGNOSIS — I10 Essential (primary) hypertension: Secondary | ICD-10-CM

## 2019-06-09 ENCOUNTER — Other Ambulatory Visit: Payer: Self-pay | Admitting: Primary Care

## 2019-06-09 DIAGNOSIS — R39198 Other difficulties with micturition: Secondary | ICD-10-CM

## 2019-06-09 DIAGNOSIS — I10 Essential (primary) hypertension: Secondary | ICD-10-CM

## 2019-06-19 ENCOUNTER — Other Ambulatory Visit: Payer: BLUE CROSS/BLUE SHIELD

## 2019-06-19 ENCOUNTER — Other Ambulatory Visit: Payer: Self-pay

## 2019-06-20 ENCOUNTER — Other Ambulatory Visit: Payer: BLUE CROSS/BLUE SHIELD

## 2019-06-23 ENCOUNTER — Encounter: Payer: Self-pay | Admitting: Primary Care

## 2019-07-16 ENCOUNTER — Ambulatory Visit (INDEPENDENT_AMBULATORY_CARE_PROVIDER_SITE_OTHER): Payer: 59 | Admitting: Family Medicine

## 2019-07-16 ENCOUNTER — Other Ambulatory Visit: Payer: Self-pay

## 2019-07-16 ENCOUNTER — Ambulatory Visit (INDEPENDENT_AMBULATORY_CARE_PROVIDER_SITE_OTHER)
Admission: RE | Admit: 2019-07-16 | Discharge: 2019-07-16 | Disposition: A | Discharge status: Home / Self Care | Payer: 59 | Source: Ambulatory Visit | Attending: Family Medicine | Admitting: Family Medicine

## 2019-07-16 ENCOUNTER — Encounter: Payer: Self-pay | Admitting: Family Medicine

## 2019-07-16 VITALS — BP 124/70 | HR 83 | Temp 98.2°F | Ht 74.0 in | Wt 241.1 lb

## 2019-07-16 DIAGNOSIS — M542 Cervicalgia: Secondary | ICD-10-CM | POA: Diagnosis not present

## 2019-07-16 MED ORDER — BACLOFEN 10 MG PO TABS: 10.0000 mg | ORAL_TABLET | Freq: Three times a day (TID) | ORAL | 0 refills | Status: DC

## 2019-07-16 MED ORDER — NAPROXEN 500 MG PO TABS: 500.0000 mg | ORAL_TABLET | Freq: Two times a day (BID) | ORAL | 0 refills | Status: AC | PRN

## 2019-07-16 NOTE — Progress Notes (Signed)
Subjective:    Patient ID: Wesley Ward, male    DOB: 08-31-1960, 59 y.o.   MRN: [id number]  This visit occurred during the SARS-CoV-2 public health emergency.  Safety protocols were in place, including screening questions prior to the visit, additional usage of staff PPE, and extensive cleaning of exam room while observing appropriate contact time as indicated for disinfecting solutions.    HPI 59 yo pt of NP Clark presents with neck spasms and pain   Neck spasms : started with a twinge on Saturday  Gradually got worse  Worst when he went to bed last night - really locked up /could not get comfortable   Today he has been very careful  Muscles are very sore  If he moves it- hurts more  R side-at occiput - is tight - it hurts to touch and gets into a spasm   Pain does not radiate at all   Limited rom - can only rotate to the left   Today- has had a mild headache   He has not used heat yet or ice   He has not taken any medicines at all   Uses a memory foam pillow    Has had this once before  It did get better with naproxyn and muscle relaxer    Patient Active Problem List   Diagnosis Date Noted  . Neck pain on right side 07/16/2019  . Sleep disturbance 05/13/2018  . Chronic shoulder pain 05/13/2018  . Dental abscess 05/13/2018  . Preventative health care 07/04/2015  . Difficulty urinating 05/28/2015  . Essential hypertension 03/16/2015   Past Medical History:  Diagnosis Date  . Essential hypertension    Past Surgical History:  Procedure Laterality Date  . OTHER SURGICAL HISTORY     testiuclar surgery  . SURGERY SCROTAL / TESTICULAR     Social History   Tobacco Use  . Smoking status: Former Games developer  . Smokeless tobacco: Never Used  . Tobacco comment: avg 2 cigs/day  Substance Use Topics  . Alcohol use: No    Alcohol/week: 0.0 standard drinks  . Drug use: No   Family History  Problem Relation Age of Onset  . Cancer Father        Unsure  .  Ovarian cancer Mother   . Congestive Heart Failure Brother   . Hypertension Brother    Allergies  Allergen Reactions  . Ace Inhibitors Swelling   Current Outpatient Medications on File Prior to Visit  Medication Sig Dispense Refill  . amLODipine (NORVASC) 10 MG tablet Take 1 tablet by mouth once daily for blood pressure 90 tablet 0  . hydrochlorothiazide (MICROZIDE) 12.5 MG capsule TAKE 1 CAPSULE BY MOUTH ONCE DAILY FOR BLOOD PRESSURE 90 capsule 0  . tamsulosin (FLOMAX) 0.4 MG CAPS capsule TAKE 1 CAPSULE BY MOUTH ONCE DAILY FOR  URINATION 90 capsule 0  . traZODone (DESYREL) 50 MG tablet Take 0.5-1 tablets (25-50 mg total) by mouth at bedtime as needed for sleep. 30 tablet 0   No current facility-administered medications on file prior to visit.    Review of Systems  Constitutional: Negative for activity change, appetite change, fatigue, fever and unexpected weight change.  HENT: Negative for congestion, rhinorrhea, sore throat and trouble swallowing.   Eyes: Negative for pain, redness, itching and visual disturbance.  Respiratory: Negative for cough, chest tightness, shortness of breath and wheezing.   Cardiovascular: Negative for chest pain and palpitations.  Gastrointestinal: Negative for abdominal pain, blood in stool, constipation,  diarrhea and nausea.  Endocrine: Negative for cold intolerance, heat intolerance, polydipsia and polyuria.  Genitourinary: Negative for difficulty urinating, dysuria, frequency and urgency.  Musculoskeletal: Positive for neck pain and neck stiffness. Negative for arthralgias, joint swelling and myalgias.  Skin: Negative for pallor and rash.  Neurological: Negative for dizziness, tremors, weakness, numbness and headaches.       Occasional headache Not right now  Hematological: Negative for adenopathy. Does not bruise/bleed easily.  Psychiatric/Behavioral: Negative for decreased concentration and dysphoric mood. The patient is not nervous/anxious.         Objective:   Physical Exam Constitutional:      General: He is not in acute distress.    Appearance: Normal appearance. He is well-developed. He is obese. He is not ill-appearing or diaphoretic.  HENT:     Head: Normocephalic and atraumatic.  Eyes:     General: No scleral icterus.    Conjunctiva/sclera: Conjunctivae normal.     Pupils: Pupils are equal, round, and reactive to light.  Cardiovascular:     Rate and Rhythm: Normal rate and regular rhythm.  Pulmonary:     Effort: Pulmonary effort is normal.     Breath sounds: Normal breath sounds. No wheezing or rales.  Abdominal:     General: Bowel sounds are normal. There is no distension.     Palpations: Abdomen is soft.     Tenderness: There is no abdominal tenderness.  Musculoskeletal:        General: Tenderness present.     Cervical back: Neck supple. Spasms, torticollis, tenderness and crepitus present. No swelling, deformity, erythema, signs of trauma or bony tenderness. Pain with movement present. Decreased range of motion.     Lumbar back: No bony tenderness.     Comments: CS-no bony tenderness Mildly tender R trap/ occiput and cervical musculature Very limited rom due to pain (can rotate to left slightly)  Significant spasm  No neuro changes  Lymphadenopathy:     Cervical: No cervical adenopathy.  Skin:    General: Skin is warm and dry.     Coloration: Skin is not pale.     Findings: No erythema or rash.  Neurological:     Mental Status: He is alert.     Cranial Nerves: No cranial nerve deficit.     Sensory: No sensory deficit.     Motor: No atrophy or abnormal muscle tone.     Coordination: Coordination normal.     Deep Tendon Reflexes: Reflexes are normal and symmetric.     Comments: Nl fxn of both UEs Nl grip  Psychiatric:        Mood and Affect: Mood normal.           Assessment & Plan:   Problem List Items Addressed This Visit      Other   Neck pain on right side - Primary    This is  recurrent/acute on chronic  R sided with spasm and no radicular symptoms Xray today in light of length of problem  Refilled naprosyn and baclofen today/caution of sedation  inst to use gentle heat / also rom as tolerated  Pending rad film review Would likely benefit from PT       Relevant Orders   DG Cervical Spine Complete    Other Visit Diagnoses    Neck pain       Relevant Medications   baclofen (LIORESAL) 10 MG tablet   naproxen (NAPROSYN) 500 MG tablet

## 2019-07-16 NOTE — Assessment & Plan Note (Signed)
This is recurrent/acute on chronic  R sided with spasm and no radicular symptoms Xray today in light of length of problem  Refilled naprosyn and baclofen today/caution of sedation  inst to use gentle heat / also rom as tolerated  Pending rad film review Would likely benefit from PT

## 2019-07-16 NOTE — Patient Instructions (Addendum)
Xray now  We will have a reading tomorrow and let you know  Use heat for 10 minutes at a time Try the naproxen with food and also the baclofen with caution of sedation   I think physical therapy may be a good idea- let's see how your xray looks first

## 2019-07-17 ENCOUNTER — Telehealth: Payer: Self-pay | Admitting: *Deleted

## 2019-07-17 NOTE — Telephone Encounter (Signed)
Left VM requesting pt to call the office back regarding xray results 

## 2019-07-22 ENCOUNTER — Other Ambulatory Visit: Payer: BLUE CROSS/BLUE SHIELD

## 2019-07-22 ENCOUNTER — Telehealth: Payer: Self-pay | Admitting: Family Medicine

## 2019-07-22 DIAGNOSIS — M542 Cervicalgia: Secondary | ICD-10-CM

## 2019-07-22 NOTE — Telephone Encounter (Signed)
Addressed through result notes  

## 2019-07-22 NOTE — Telephone Encounter (Signed)
-----   Message from Shon Millet, [place] sent at 07/22/2019 12:32 PM EDT ----- Pt notified of xray results and Dr. Royden Purl comments. Pt agrees with PT, he said it doesn't matter what city he has it in [place] or [place] if fine, I advised pt our [facility] will call to schedule appt

## 2019-07-23 NOTE — Telephone Encounter (Signed)
Appt scheduled and patient aware.  

## 2019-07-25 ENCOUNTER — Encounter: Payer: Self-pay | Admitting: Primary Care

## 2019-07-28 ENCOUNTER — Encounter: Payer: Self-pay | Admitting: Primary Care

## 2019-07-31 ENCOUNTER — Ambulatory Visit: Payer: 59 | Admitting: Physical Therapy

## 2019-08-07 ENCOUNTER — Other Ambulatory Visit: Payer: Self-pay

## 2019-08-07 ENCOUNTER — Ambulatory Visit: Payer: 59 | Attending: Family Medicine | Admitting: Physical Therapy

## 2019-08-07 DIAGNOSIS — M542 Cervicalgia: Secondary | ICD-10-CM | POA: Insufficient documentation

## 2019-08-07 DIAGNOSIS — R29898 Other symptoms and signs involving the musculoskeletal system: Secondary | ICD-10-CM | POA: Insufficient documentation

## 2019-08-07 DIAGNOSIS — M6281 Muscle weakness (generalized): Secondary | ICD-10-CM | POA: Diagnosis present

## 2019-08-07 DIAGNOSIS — M62838 Other muscle spasm: Secondary | ICD-10-CM | POA: Insufficient documentation

## 2019-08-07 DIAGNOSIS — R293 Abnormal posture: Secondary | ICD-10-CM | POA: Insufficient documentation

## 2019-08-07 NOTE — Therapy (Signed)
[facility] [address]  [address] [place], [place], [address] Phone: [phone]   Fax:  [phone]  Physical Therapy Evaluation  Patient Details  Name: Wesley Ward MRN: [id number] Date of Birth: [birth year] Referring Provider (PT): Judy Pimple, MD   Encounter Date: 08/07/2019  PT End of Session - 08/07/19 1530    Visit Number  1    Number of Visits  16    Date for PT Re-Evaluation  10/02/19    Authorization Type  [employer] (prior auth not required)    Authorization - Number of Visits  --   VL = 30   PT Start Time  1530    PT Stop Time  1620    PT Time Calculation (min)  50 min    Activity Tolerance  Patient tolerated treatment well    Behavior During Therapy  [facility] for tasks assessed/performed       Past Medical History:  Diagnosis Date  . Essential hypertension     Past Surgical History:  Procedure Laterality Date  . OTHER SURGICAL HISTORY     testiuclar surgery  . SURGERY SCROTAL / TESTICULAR      There were no vitals filed for this visit.   Subjective Assessment - 08/07/19 1534    Subjective  Pt reports he thinks he is having intermittent spasms in one of the muscles in the R side of his neck. No pain today but states pain has been off and on for a couple years, getting more severe lately to the point he was having trouble moving when pain present. He also notes limited R shoulder ROM and strength from what he was told was a frozen shoulder (current in it's "3rd trimester") for which he has been working on home exercises provided by MD.    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 No acute bony abnormality of the cervical spine. 2. 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    Patient Stated Goals  "To keep my neck from spasming as often."    Currently  in Pain?  No/denies    Pain Score  0-No pain   up to "11.5-12/10" at worst   Pain Location  Neck    Pain Orientation  Right    Pain Descriptors / Indicators  Spasm;Tightness   "pulling"   Pain Type  Acute pain;Chronic pain    Pain Radiating Towards  n/a    Pain Onset  Other (comment)   intermittent over past couple years   Pain Frequency  Intermittent    Aggravating Factors   uncertain    Pain Relieving Factors  sometimes heat or ice    Effect of Pain on Daily Activities  unable to move/turn head when pain present         [facility] PT Assessment - 08/07/19 1530      Assessment   Medical Diagnosis  Chronic intermittent R neck pain    Referring Provider (PT)  Judy Pimple, MD    Onset Date/Surgical Date  --   "couple years"   Hand Dominance  Left    Next MD Visit  08/22/19 - physical with Doreene Nest, NP    Prior Therapy  none      Precautions   Precautions  None  Balance Screen   Has the patient fallen in the past 6 months  Yes    How many times?  1    Has the patient had a decrease in activity level because of a fear of falling?   No    Is the patient reluctant to leave their home because of a fear of falling?   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 program, tries to walk 3-4 miles a couple days per week      Cognition   Overall Cognitive Status  Within Functional Limits for tasks assessed      Observation/Other Assessments   Focus on Therapeutic Outcomes (FOTO)   Neck - 42% (58% limitation); Predicted 61% (39% limitation)      Posture/Postural Control   Posture/Postural Control  Postural limitations    Postural Limitations   Forward head;Rounded Shoulders    Posture Comments  head resting at ~11 of R rotation      ROM / Strength   AROM / PROM / Strength  AROM;Strength      AROM   AROM Assessment Site  Cervical;Shoulder    Right/Left Shoulder  Right;Left    Right Shoulder Flexion  112 Degrees    Right Shoulder ABduction  118 Degrees    Right Shoulder Internal Rotation  --   FIR to T12   Right Shoulder External Rotation  --   FER to T3   Left Shoulder Flexion  158 Degrees    Left Shoulder ABduction  164 Degrees    Left Shoulder Internal Rotation  --   FIR to T7   Left Shoulder External Rotation  --   FER to T4   Cervical Flexion  61 - slight pull    Cervical Extension  42 - pulling    Cervical - Right Side Bend  28 - pulling    Cervical - Left Side Bend  19 - "acting up"    Cervical - Right Rotation  70    Cervical - Left Rotation  61      Strength   Strength Assessment Site  Shoulder    Right/Left Shoulder  Right;Left    Right Shoulder Flexion  3+/5   for available ROM - limited by pain   Right Shoulder ABduction  4-/5    Right Shoulder Internal Rotation  4+/5    Right Shoulder External Rotation  3+/5   limited by pain   Left Shoulder Flexion  5/5    Left Shoulder ABduction  5/5    Left Shoulder Internal Rotation  5/5    Left Shoulder External Rotation  5/5      Palpation   Palpation comment  increased muscle tension with ttp over L UT and L lateral cerivcal paraspinals                  Objective measurements completed on examination: See above findings.      [facility] Adult PT Treatment/Exercise - 08/07/19 1530      Exercises   Exercises  Neck      Neck Exercises: Seated  Neck Retraction  10 reps;5 secs    Other Seated Exercise  Scap retraction & depression 10 x 5"      Neck Exercises: Stretches   Upper Trapezius Stretch  Right;30 seconds;1 rep    Corner Stretch  30 seconds;1 rep    Corner Stretch Limitations  low doorway pec stretch             PT  Education - 08/07/19 1618    Education Details  PT eval findings, anticipated POC and initial HEP    Person(s) Educated  Patient    Methods  Explanation;Demonstration;Verbal cues;Handout    Comprehension  Verbalized understanding;Returned demonstration;Verbal cues required;Need further instruction       PT Short Term Goals - 08/07/19 1620      PT SHORT TERM GOAL #1   Title  Patient will be independent with initial HEP    Status  New    Target Date  08/28/19      PT SHORT TERM GOAL #2   Title  Patient will verbalize/demonstrate good awareness of neutral spine posture and proper body mechanics for daily tasks    Status  New    Target Date  09/04/19        PT Long Term Goals - 08/07/19 1620      PT LONG TERM GOAL #1   Title  Patient will be independent with ongoing/advanced HEP    Status  New    Target Date  10/02/19      PT LONG TERM GOAL #2   Title  Patient to demonstrate ability to achieve and maintain good spinal alignment/posturing    Status  New    Target Date  10/02/19      PT LONG TERM GOAL #3   Title  Patient to report pain reduction in frequency and intensity by >/= 50%    Status  New    Target Date  10/02/19      PT LONG TERM GOAL #4   Title  Patient to improve cervical AROM to WNL without pain provocation    Status  New    Target Date  10/02/19      PT LONG TERM GOAL #5   Title  Patient to improve R shoulder AROM to [facility] without pain provocation    Status  New    Target Date  10/02/19      PT LONG TERM GOAL #6   Title  Patient will demonstrate improved R shoulder strength to >/=4 to 4+/5 for functional UE use    Status  New    Target Date  10/02/19             Plan - 08/07/19 1618    Clinical Impression Statement  Peirce is a 59 y/o male who present to OP PT for intermittent acute on chronic R sided neck pain for past couple years. Pt believes pain may be due to muscle spasms in his R lateral neck and upper shoulder that have gotten more severe  lately to the point where his neck "locks up" at times with pain up to 11.5-12/10. X-rays reveal mild multilevel degenerative changes. Deficits include postural dysfunction with forward head and rounded shoulder posture with mildly protracted R scapula, limited cervical ROM in all planes and increased muscle tension in R cervical paraspinals, UT, LS and B pecs. PMHx also significant for R adhesive capsulitis with continued restriction of R shoulder ROM and strength. Rashed will benefit from skilled PT to address above deficits  and current functional limitations to restore functional cervical and R shoulder ROM and decrease pain interference with work and recreational activities.    Personal Factors and Comorbidities  Comorbidity 2;Time since onset of injury/illness/exacerbation;Past/Current Experience    Comorbidities  R adhesive capsulitis/chronic R shoulder pain, HTN    Examination-Activity Limitations  Reach Overhead;Lift;Carry    Examination-Participation Restrictions  Cleaning;Community Activity;Driving;Yard Work    Conservation officer, historic buildings  Evolving/Moderate complexity    Clinical Decision Making  Moderate    Rehab Potential  Good    PT Frequency  2x / week    PT Duration  8 weeks    PT Treatment/Interventions  ADLs/Self Care Home Management;Cryotherapy;Electrical Stimulation;Iontophoresis 4mg /ml Dexamethasone;Moist Heat;Traction;Ultrasound;Therapeutic activities;Therapeutic exercise;Neuromuscular re-education;Patient/family education;Manual techniques;Passive range of motion;Dry needling;Taping;Vasopneumatic Device;Spinal Manipulations;Joint Manipulations    PT Next Visit Plan  Review initial HEP; posture & body mechanics training; postural strengthening; cervical & R shoulder ROM; manual therapy for abnormal muscle tension; modalities PRN    PT Home Exercise Plan  5/27 - cervical retraction, UT and low doorway pec stretches, scap retraction/depression    Consulted and Agree with Plan of  Care  Patient       Patient will benefit from skilled therapeutic intervention in order to improve the following deficits and impairments:  Decreased activity tolerance, Decreased knowledge of precautions, Decreased mobility, Decreased range of motion, Decreased strength, Hypomobility, Increased fascial restricitons, Increased muscle spasms, Impaired perceived functional ability, Impaired flexibility, Impaired UE functional use, Improper body mechanics, Postural dysfunction, Pain  Visit Diagnosis: Cervicalgia  Abnormal posture  Other muscle spasm  Muscle weakness (generalized)  Other symptoms and signs involving the musculoskeletal system     Problem List Patient Active Problem List   Diagnosis Date Noted  . Neck pain on right side 07/16/2019  . Sleep disturbance 05/13/2018  . Chronic shoulder pain 05/13/2018  . Dental abscess 05/13/2018  . Preventative health care 07/04/2015  . Difficulty urinating 05/28/2015  . Essential hypertension 03/16/2015    05/14/2015, PT, MPT 08/07/2019, 8:10 PM  [facility] [address]  [address] [place], [place], [place] Phone: [phone]   Fax:  [phone]  Name: DEVELL PARKERSON MRN: Alfredia Client Date of Birth: [birth year]

## 2019-08-07 NOTE — Patient Instructions (Signed)
    Home exercise program created by Chazz Philson, PT.  For questions, please contact Amy Belloso via phone at [phone] or email at [email or web address]  [facility] [place] [address]  [address] [place], [place], [address] Phone: [phone]   Fax:  [phone]    

## 2019-08-08 NOTE — Telephone Encounter (Signed)
Patient have been canceling and rescheduling his appointment.

## 2019-08-13 ENCOUNTER — Ambulatory Visit: Payer: 59 | Attending: Family Medicine

## 2019-08-13 ENCOUNTER — Other Ambulatory Visit: Payer: Self-pay

## 2019-08-13 DIAGNOSIS — M62838 Other muscle spasm: Secondary | ICD-10-CM | POA: Insufficient documentation

## 2019-08-13 DIAGNOSIS — M6281 Muscle weakness (generalized): Secondary | ICD-10-CM | POA: Diagnosis present

## 2019-08-13 DIAGNOSIS — R29898 Other symptoms and signs involving the musculoskeletal system: Secondary | ICD-10-CM | POA: Insufficient documentation

## 2019-08-13 DIAGNOSIS — M542 Cervicalgia: Secondary | ICD-10-CM | POA: Diagnosis not present

## 2019-08-13 DIAGNOSIS — R293 Abnormal posture: Secondary | ICD-10-CM | POA: Insufficient documentation

## 2019-08-13 NOTE — Therapy (Addendum)
[facility] [address]  [address] [place], [place], [address] Phone: [phone]   Fax:  [phone]  Physical Therapy Treatment  Patient Details  Name: Wesley Ward MRN: [id number] Date of Birth: 1960/09/03 Referring Provider (PT): Judy Pimple, MD   Encounter Date: 08/13/2019  PT End of Session - 08/13/19 1625    Visit Number  2    Number of Visits  16    Date for PT Re-Evaluation  10/02/19    Authorization Type  [employer] (prior auth not required)    Authorization - Number of Visits  --   VL = 30   PT Start Time  1617    PT Stop Time  1701    PT Time Calculation (min)  44 min    Activity Tolerance  Patient tolerated treatment well    Behavior During Therapy  [facility] for tasks assessed/performed       Past Medical History:  Diagnosis Date  . Essential hypertension     Past Surgical History:  Procedure Laterality Date  . OTHER SURGICAL HISTORY     testiuclar surgery  . SURGERY SCROTAL / TESTICULAR      There were no vitals filed for this visit.  Subjective Assessment - 08/13/19 1623    Subjective  Pt. noting he had last intense R-sided neck pain two weeks ago.    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 No acute bony abnormality of the cervical spine. 2. 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    Patient Stated Goals  "To keep my neck from spasming as often."    Currently in Pain?  No/denies    Pain Score  0-No pain   pain rises up to a 10/10 at times   Pain Location  Neck    Pain Orientation  Right    Pain Descriptors / Indicators  Spasm;Tightness    Pain Type  Acute pain;Chronic pain    Pain Frequency  Intermittent    Aggravating Factors   Unsure    Pain Relieving Factors  medication, Sometimes heat or ice    Multiple Pain Sites  No                        [facility]  Adult PT Treatment/Exercise - 08/13/19 0001      Self-Care   Self-Care  Other Self-Care Comments;Lifting    Lifting  Discussed strategies to reduce cervical and lumbar strain with lifting tasks at work     Other Self-Care Comments   Discussed positioning with pt. work duties to reduce neck strain       Consulting civil engineer for Strengthening   UBE (Upper Arm Bike)  Lvl 1.0, 3 min forwards/ 3 min backwards     Cybex Row  B 10# x 15 reps       Neck Exercises: Seated   Neck Retraction  10 reps;5 secs    Neck Retraction Limitations  Cues to avoid excessive scapular elevation       Shoulder Exercises: Supine   External Rotation  Right;10 reps;AAROM    External Rotation Limitations  wand     Flexion  10 reps;AAROM    Flexion Limitations  wand     ABduction  Right;10 reps;AAROM    ABduction Limitations  wand - cueing for proper position  Shoulder Exercises: Pulleys   Flexion  3 minutes    Scaption  3 minutes      Neck Exercises: Stretches   Upper Trapezius Stretch  Right;30 seconds;2 reps   Cues required for proper positioning    Corner Stretch  30 seconds;2 reps    Corner Stretch Limitations  low doorway pec stretch               PT Short Term Goals - 08/13/19 1626      PT SHORT TERM GOAL #1   Title  Patient will be independent with initial HEP    Status  On-going    Target Date  08/28/19      PT SHORT TERM GOAL #2   Title  Patient will verbalize/demonstrate good awareness of neutral spine posture and proper body mechanics for daily tasks    Status  On-going    Target Date  09/04/19        PT Long Term Goals - 08/13/19 1626      PT LONG TERM GOAL #1   Title  Patient will be independent with ongoing/advanced HEP    Status  On-going      PT LONG TERM GOAL #2   Title  Patient to demonstrate ability to achieve and maintain good spinal alignment/posturing    Status  On-going      PT LONG TERM GOAL #3   Title  Patient to report pain reduction in  frequency and intensity by >/= 50%    Status  On-going      PT LONG TERM GOAL #4   Title  Patient to improve cervical AROM to WNL without pain provocation    Status  On-going      PT LONG TERM GOAL #5   Title  Patient to improve R shoulder AROM to [facility] without pain provocation    Status  On-going      PT LONG TERM GOAL #6   Title  Patient will demonstrate improved R shoulder strength to >/=4 to 4+/5 for functional UE use    Status  On-going            Plan - 08/13/19 1626    Clinical Impression Statement  Damani doing well today.  Reports episode of "severe neck pain" has not occurred in ~ 2 weeks however does deal with intermittent neck pain throughout day. With good overall demonstration of PT HEP with review however did require cueing for proper positioning with UT stretch to ensure proper stretch sensation.  Able to progress R shoulder AAROM activities and initiated R shoulder flexion, scaption pulleys with pt. noting good R shoulder ROM challenge without increased pain.  Ended visit with pt. denying pain.    Comorbidities  R adhesive capsulitis/chronic R shoulder pain, HTN    Rehab Potential  Good    PT Frequency  2x / week    PT Treatment/Interventions  ADLs/Self Care Home Management;Cryotherapy;Electrical Stimulation;Iontophoresis 4mg /ml Dexamethasone;Moist Heat;Traction;Ultrasound;Therapeutic activities;Therapeutic exercise;Neuromuscular re-education;Patient/family education;Manual techniques;Passive range of motion;Dry needling;Taping;Vasopneumatic Device;Spinal Manipulations;Joint Manipulations    PT Next Visit Plan  Posture & body mechanics training; postural strengthening; cervical & R shoulder ROM; manual therapy for abnormal muscle tension; modalities PRN    PT Home Exercise Plan  5/27 - cervical retraction, UT and low doorway pec stretches, scap retraction/depression; 08/14/19 - flexion, abd pulleys   Consulted and Agree with Plan of Care  Patient       Patient will  benefit from skilled therapeutic intervention in order to  improve the following deficits and impairments:  Decreased activity tolerance, Decreased knowledge of precautions, Decreased mobility, Decreased range of motion, Decreased strength, Hypomobility, Increased fascial restricitons, Increased muscle spasms, Impaired perceived functional ability, Impaired flexibility, Impaired UE functional use, Improper body mechanics, Postural dysfunction, Pain  Visit Diagnosis: Cervicalgia  Abnormal posture  Other muscle spasm  Muscle weakness (generalized)     Problem List Patient Active Problem List   Diagnosis Date Noted  . Neck pain on right side 07/16/2019  . Sleep disturbance 05/13/2018  . Chronic shoulder pain 05/13/2018  . Dental abscess 05/13/2018  . Preventative health care 07/04/2015  . Difficulty urinating 05/28/2015  . Essential hypertension 03/16/2015    Bess Harvest, PTA 08/13/19 9:34 PM   [facility] [address]  [place], [place], [address] Phone: [phone]   Fax:  [phone]  Name: DAMEAN POFFENBERGER MRN: [id number] Date of Birth: [birth year]

## 2019-08-15 ENCOUNTER — Other Ambulatory Visit: Payer: 59

## 2019-08-19 ENCOUNTER — Other Ambulatory Visit: Payer: Self-pay

## 2019-08-19 ENCOUNTER — Ambulatory Visit: Payer: 59

## 2019-08-19 DIAGNOSIS — M62838 Other muscle spasm: Secondary | ICD-10-CM

## 2019-08-19 DIAGNOSIS — M542 Cervicalgia: Secondary | ICD-10-CM

## 2019-08-19 DIAGNOSIS — R293 Abnormal posture: Secondary | ICD-10-CM

## 2019-08-19 DIAGNOSIS — R29898 Other symptoms and signs involving the musculoskeletal system: Secondary | ICD-10-CM

## 2019-08-19 DIAGNOSIS — M6281 Muscle weakness (generalized): Secondary | ICD-10-CM

## 2019-08-19 NOTE — Therapy (Signed)
[facility] [address]  [place], [place], [address] Phone: [phone]   Fax:  [phone]  Physical Therapy Treatment  Patient Details  Name: Wesley Ward MRN: [id number] Date of Birth: 1960/03/14 Referring Provider (PT): Abner Greenspan, MD   Encounter Date: 08/19/2019  PT End of Session - 08/19/19 1534    Visit Number  3    Number of Visits  16    Date for PT Re-Evaluation  10/02/19    Authorization Type  [employer] (prior auth not required)    Authorization - Number of Visits  --   VL = 30   PT Start Time  1528    PT Stop Time  1610    PT Time Calculation (min)  42 min    Activity Tolerance  Patient tolerated treatment well    Behavior During Therapy  [facility] for tasks assessed/performed       Past Medical History:  Diagnosis Date  . Essential hypertension     Past Surgical History:  Procedure Laterality Date  . OTHER SURGICAL HISTORY     testiuclar surgery  . SURGERY SCROTAL / TESTICULAR      There were no vitals filed for this visit.  Subjective Assessment - 08/19/19 1530    Subjective  Doing well with no new complaints.  Came straight here from work.    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 No acute bony abnormality of the cervical spine. 2. 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    Patient Stated Goals  "To keep my neck from spasming as often."    Currently in Pain?  No/denies    Pain Score  0-No pain    Pain Location  Neck    Pain Orientation  Right    Multiple Pain Sites  No                        [facility] Adult PT Treatment/Exercise - 08/19/19 0001      Neck Exercises: Machines for Strengthening   UBE (Upper Arm Bike)  Lvl 1.0, 3 min forwards/ 3 min backwards     Cybex Row  B 25# x 10 reps       Neck Exercises: Theraband   Shoulder External  Rotation  10 reps;Red    Shoulder External Rotation Limitations  leaning on doorseal     Horizontal ABduction  15 reps;Red    Horizontal ABduction Limitations  leaning on wall       Shoulder Exercises: Sidelying   External Rotation  Right;10 reps;Weights;Strengthening    External Rotation Weight (lbs)  2    External Rotation Limitations  cued pt. for scapular retraction     ABduction  Right;10 reps;Strengthening;Weights    ABduction Limitations  tolerated well 0-140dg    Other Sidelying Exercises  R horizontal abduction laying on L side x 15       Shoulder Exercises: Therapy Ball   Flexion  Right;10 reps    Flexion Limitations  orange p-ball roll up wall     ABduction  Right;10 reps    ABduction Limitations  orane p-ball roll up wall       Neck Exercises: Stretches   Upper Trapezius Stretch  Right;Left;1 rep;30 seconds    Upper Trapezius Stretch Limitations  hands anchored on table  Levator Stretch  Right;Left;1 rep;30 seconds    Levator Stretch Limitations  B hands anchored on table     Corner Stretch  30 seconds;3 reps    Corner Stretch Limitations  low, mid, high doorway pec stretch             PT Education - 08/19/19 1759    Education Details  HEP update;  mid doorway stretch, B ER at wall with red TB    Person(s) Educated  Patient    Methods  Explanation;Demonstration;Verbal cues;Handout    Comprehension  Verbalized understanding;Returned demonstration;Verbal cues required       PT Short Term Goals - 08/13/19 1626      PT SHORT TERM GOAL #1   Title  Patient will be independent with initial HEP    Status  On-going    Target Date  08/28/19      PT SHORT TERM GOAL #2   Title  Patient will verbalize/demonstrate good awareness of neutral spine posture and proper body mechanics for daily tasks    Status  On-going    Target Date  09/04/19        PT Long Term Goals - 08/13/19 1626      PT LONG TERM GOAL #1   Title  Patient will be independent with  ongoing/advanced HEP    Status  On-going      PT LONG TERM GOAL #2   Title  Patient to demonstrate ability to achieve and maintain good spinal alignment/posturing    Status  On-going      PT LONG TERM GOAL #3   Title  Patient to report pain reduction in frequency and intensity by >/= 50%    Status  On-going      PT LONG TERM GOAL #4   Title  Patient to improve cervical AROM to WNL without pain provocation    Status  On-going      PT LONG TERM GOAL #5   Title  Patient to improve R shoulder AROM to [facility] without pain provocation    Status  On-going      PT LONG TERM GOAL #6   Title  Patient will demonstrate improved R shoulder strength to >/=4 to 4+/5 for functional UE use    Status  On-going            Plan - 08/19/19 1534    Clinical Impression Statement  Wesley Ward doing well today.  Able to tolerate progression of R shoulder ROM and periscapular strengthening activities well today without pain.  Pt. noting neck continues to improve and denies severe neck pain episodes recently.  Notes he has been trying to be more conscious of his positioning with work related equipment handling to reduce strain on shoulder and neck.  Ended visit with HEP updated handout issued.    Comorbidities  R adhesive capsulitis/chronic R shoulder pain, HTN    Rehab Potential  Good    PT Frequency  2x / week    PT Treatment/Interventions  ADLs/Self Care Home Management;Cryotherapy;Electrical Stimulation;Iontophoresis 4mg /ml Dexamethasone;Moist Heat;Traction;Ultrasound;Therapeutic activities;Therapeutic exercise;Neuromuscular re-education;Patient/family education;Manual techniques;Passive range of motion;Dry needling;Taping;Vasopneumatic Device;Spinal Manipulations;Joint Manipulations    PT Next Visit Plan  Posture & body mechanics training; postural strengthening; cervical & R shoulder ROM; manual therapy for abnormal muscle tension; modalities PRN    PT Home Exercise Plan  5/27 - cervical retraction, UT and low  doorway pec stretches, scap retraction/depression; 08/13/19 - flexion, scaption pulleys    Consulted and Agree with Plan of Care  Patient       Patient will benefit from skilled therapeutic intervention in order to improve the following deficits and impairments:  Decreased activity tolerance, Decreased knowledge of precautions, Decreased mobility, Decreased range of motion, Decreased strength, Hypomobility, Increased fascial restricitons, Increased muscle spasms, Impaired perceived functional ability, Impaired flexibility, Impaired UE functional use, Improper body mechanics, Postural dysfunction, Pain  Visit Diagnosis: Cervicalgia  Abnormal posture  Other muscle spasm  Muscle weakness (generalized)  Other symptoms and signs involving the musculoskeletal system     Problem List Patient Active Problem List   Diagnosis Date Noted  . Neck pain on right side 07/16/2019  . Sleep disturbance 05/13/2018  . Chronic shoulder pain 05/13/2018  . Dental abscess 05/13/2018  . Preventative health care 07/04/2015  . Difficulty urinating 05/28/2015  . Essential hypertension 03/16/2015    Kermit Balo, PTA 08/19/19 6:24 PM   [facility] [address]  [address] [place], [place], [address] Phone: [phone]   Fax:  [phone]  Name: Wesley Ward MRN: [id number] Date of Birth: 1961/01/26

## 2019-08-22 ENCOUNTER — Encounter: Payer: Self-pay | Admitting: Primary Care

## 2019-08-26 ENCOUNTER — Other Ambulatory Visit: Payer: Self-pay

## 2019-08-26 ENCOUNTER — Ambulatory Visit: Payer: 59

## 2019-08-26 DIAGNOSIS — M6281 Muscle weakness (generalized): Secondary | ICD-10-CM

## 2019-08-26 DIAGNOSIS — M62838 Other muscle spasm: Secondary | ICD-10-CM

## 2019-08-26 DIAGNOSIS — R29898 Other symptoms and signs involving the musculoskeletal system: Secondary | ICD-10-CM

## 2019-08-26 DIAGNOSIS — M542 Cervicalgia: Secondary | ICD-10-CM

## 2019-08-26 DIAGNOSIS — R293 Abnormal posture: Secondary | ICD-10-CM

## 2019-08-26 NOTE — Therapy (Addendum)
[facility] [address]  [place], [place], [address] Phone: [phone]   Fax:  [phone]  Physical Therapy Treatment / Discharge Summary  Patient Details  Name: Wesley Ward MRN: [id number] Date of Birth: 10-09-1960 Referring Provider (PT): Abner Greenspan, MD   Encounter Date: 08/26/2019   PT End of Session - 08/26/19 1530    Visit Number 4    Number of Visits 16    Date for PT Re-Evaluation 10/02/19    Authorization Type [employer] (prior auth not required)    Authorization - Number of Visits --   VL = 30   PT Start Time 3762    PT Stop Time 1615    PT Time Calculation (min) 45 min    Activity Tolerance Patient tolerated treatment well    Behavior During Therapy [facility] for tasks assessed/performed           Past Medical History:  Diagnosis Date  . Essential hypertension     Past Surgical History:  Procedure Laterality Date  . OTHER SURGICAL HISTORY     testiuclar surgery  . SURGERY SCROTAL / TESTICULAR      There were no vitals filed for this visit.   Subjective Assessment - 08/26/19 1533    Subjective Pt reports he hsan't had any trouble with his neck. His shoulder is a lot better, but he feels it sometimes when he reaches a certain way. However, it doesn't stop him from doing anything.    Pertinent History recovering from R frozen shoulder (working through MD provided HEP)    Diagnostic tests Cervical x-ray 07/16/19 - 1. No acute bony abnormality of the cervical spine. 2. 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    Patient Stated Goals "To keep my neck from spasming as often."    Currently in Pain? No/denies                             [facility] Adult PT Treatment/Exercise - 08/26/19 0001      Neck Exercises: Machines for Strengthening   UBE (Upper Arm Bike) Lvl 1.0, 3 min forwards/ 3 min backwards     Cybex Row B 25# x 15 reps       Neck Exercises:  Theraband   Shoulder External Rotation 10 reps;Red    Shoulder External Rotation Limitations corner of door, 5" hold    Horizontal ABduction 15 reps;Red    Horizontal ABduction Limitations leaning on wall       Neck Exercises: Standing   Other Standing Exercises Shoulder AAROM F/S/A x5 reps each with cane      Neck Exercises: Prone   Axial Exension 10 reps    Axial Extension Limitations Green SB prone I's 2# DB 10x5"      Shoulder Exercises: Therapy Ball   Flexion Right;10 reps    Flexion Limitations orange p-ball roll up wall     ABduction Right;10 reps    ABduction Limitations orane p-ball roll up wall                     PT Short Term Goals - 08/13/19 1626      PT SHORT TERM GOAL #1   Title Patient will be independent with initial HEP    Status On-going    Target Date 08/28/19      PT SHORT TERM GOAL #2   Title  Patient will verbalize/demonstrate good awareness of neutral spine posture and proper body mechanics for daily tasks    Status On-going    Target Date 09/04/19             PT Long Term Goals - 08/13/19 1626      PT LONG TERM GOAL #1   Title Patient will be independent with ongoing/advanced HEP    Status On-going      PT LONG TERM GOAL #2   Title Patient to demonstrate ability to achieve and maintain good spinal alignment/posturing    Status On-going      PT LONG TERM GOAL #3   Title Patient to report pain reduction in frequency and intensity by >/= 50%    Status On-going      PT LONG TERM GOAL #4   Title Patient to improve cervical AROM to WNL without pain provocation    Status On-going      PT LONG TERM GOAL #5   Title Patient to improve R shoulder AROM to [facility] without pain provocation    Status On-going      PT LONG TERM GOAL #6   Title Patient will demonstrate improved R shoulder strength to >/=4 to 4+/5 for functional UE use    Status On-going                 Plan - 08/26/19 1531    Clinical Impression Statement Pt  tolerated session very well with no c/o pain in neck or R shoulder. Pt has a tendency to elevate R shoulder and overuse R UT. Verbal and tactile cues provided throughout to correct form and optimize periscapular and rotator cuff mm firing. He tolerated addition of prone I's on green SB well with good posture noted.    Comorbidities R adhesive capsulitis/chronic R shoulder pain, HTN    Rehab Potential Good    PT Frequency 2x / week    PT Treatment/Interventions ADLs/Self Care Home Management;Cryotherapy;Electrical Stimulation;Iontophoresis '4mg'$ /ml Dexamethasone;Moist Heat;Traction;Ultrasound;Therapeutic activities;Therapeutic exercise;Neuromuscular re-education;Patient/family education;Manual techniques;Passive range of motion;Dry needling;Taping;Vasopneumatic Device;Spinal Manipulations;Joint Manipulations    PT Next Visit Plan Posture & body mechanics training; postural strengthening; cervical & R shoulder ROM; manual therapy for abnormal muscle tension; modalities PRN    PT Home Exercise Plan 5/27 - cervical retraction, UT and low doorway pec stretches, scap retraction/depression; 08/13/19 - flexion, scaption pulleys    Consulted and Agree with Plan of Care Patient           Patient will benefit from skilled therapeutic intervention in order to improve the following deficits and impairments:  Decreased activity tolerance, Decreased knowledge of precautions, Decreased mobility, Decreased range of motion, Decreased strength, Hypomobility, Increased fascial restricitons, Increased muscle spasms, Impaired perceived functional ability, Impaired flexibility, Impaired UE functional use, Improper body mechanics, Postural dysfunction, Pain  Visit Diagnosis: Cervicalgia  Abnormal posture  Other muscle spasm  Muscle weakness (generalized)  Other symptoms and signs involving the musculoskeletal system     Problem List Patient Active Problem List   Diagnosis Date Noted  . Neck pain on right side  07/16/2019  . Sleep disturbance 05/13/2018  . Chronic shoulder pain 05/13/2018  . Dental abscess 05/13/2018  . Preventative health care 07/04/2015  . Difficulty urinating 05/28/2015  . Essential hypertension 03/16/2015    Izell Idaho, PT, DPT 08/26/2019, 4:18 PM  [facility] [place], [place], [address] Phone: [phone]   Fax:  [phone]  Name:  CLENNON NASCA MRN: [id number] Date of Birth: 1960-10-05   PHYSICAL THERAPY DISCHARGE SUMMARY  Visits from Start of Care: 4  Current functional level related to goals / functional outcomes:   Refer to above clinical impression for status as of last visit on 08/26/2019. Patient no showed for next appointment then cancelled all remaining appointments stating was doing well. He has not returned to PT in >30 days, therefore will proceed with discharge from PT for this episode.   Remaining deficits:   As above.   Education / Equipment:   HEP  Plan: Patient agrees to discharge.  Patient goals were not met. Patient is being discharged due to being pleased with the current functional level.  ?????     Percival Spanish, PT, MPT 10/21/19, 9:58 AM  [facility] [address]  [place], [place], [address] Phone: [phone]   Fax:  [phone]

## 2019-08-28 ENCOUNTER — Ambulatory Visit: Payer: 59

## 2019-09-02 ENCOUNTER — Ambulatory Visit: Payer: 59 | Admitting: Physical Therapy

## 2019-09-09 ENCOUNTER — Ambulatory Visit: Payer: 59 | Admitting: Physical Therapy

## 2019-09-11 ENCOUNTER — Ambulatory Visit: Payer: 59 | Admitting: Physical Therapy

## 2019-09-16 ENCOUNTER — Ambulatory Visit: Payer: 59 | Admitting: Physical Therapy

## 2019-09-18 ENCOUNTER — Ambulatory Visit: Payer: 59 | Admitting: Physical Therapy

## 2019-10-13 ENCOUNTER — Telehealth: Payer: Self-pay | Admitting: Primary Care

## 2019-10-13 DIAGNOSIS — I10 Essential (primary) hypertension: Secondary | ICD-10-CM

## 2019-10-13 DIAGNOSIS — R39198 Other difficulties with micturition: Secondary | ICD-10-CM

## 2019-10-16 MED ORDER — TAMSULOSIN HCL 0.4 MG PO CAPS: ORAL_CAPSULE | ORAL | 0 refills | Status: DC

## 2019-10-16 MED ORDER — HYDROCHLOROTHIAZIDE 12.5 MG PO CAPS: ORAL_CAPSULE | ORAL | 0 refills | Status: DC

## 2019-10-16 MED ORDER — AMLODIPINE BESYLATE 10 MG PO TABS: ORAL_TABLET | ORAL | 0 refills | Status: DC

## 2019-10-16 NOTE — Telephone Encounter (Signed)
Sent 30 days supply to [facility] for patient and wife have been notified.

## 2019-10-16 NOTE — Telephone Encounter (Signed)
Patient's family called in and scheduled follow up with PCP for medication. Requesting medications be filled till appointment on 8/25.

## 2019-10-16 NOTE — Addendum Note (Signed)
Addended by: Tawnya Crook on: 10/16/2019 02:45 PM   Modules accepted: Orders

## 2019-11-05 ENCOUNTER — Other Ambulatory Visit: Payer: Self-pay

## 2019-11-05 ENCOUNTER — Encounter: Payer: Self-pay | Admitting: Primary Care

## 2019-11-05 ENCOUNTER — Ambulatory Visit (INDEPENDENT_AMBULATORY_CARE_PROVIDER_SITE_OTHER): Payer: 59 | Admitting: Primary Care

## 2019-11-05 VITALS — BP 130/86 | HR 88 | Temp 96.3°F | Ht 74.0 in | Wt 229.0 lb

## 2019-11-05 DIAGNOSIS — K029 Dental caries, unspecified: Secondary | ICD-10-CM | POA: Diagnosis not present

## 2019-11-05 DIAGNOSIS — R39198 Other difficulties with micturition: Secondary | ICD-10-CM

## 2019-11-05 DIAGNOSIS — I10 Essential (primary) hypertension: Secondary | ICD-10-CM

## 2019-11-05 MED ORDER — AMOXICILLIN 875 MG PO TABS: 875.0000 mg | ORAL_TABLET | Freq: Two times a day (BID) | ORAL | 0 refills | Status: AC

## 2019-11-05 NOTE — Assessment & Plan Note (Addendum)
Patient has open area behind front dental carries, patient is following up with [facility] dental school in September.   Prescribed Amoxicillin 875mg  twice daily for 7 days in order to resolve abscess so that he may proceed with procedure.   Agree with assessment and plan. , NP

## 2019-11-05 NOTE — Patient Instructions (Addendum)
  Please start Amoxicillin 875mg  twice a day for 7 days    Stop by the lab prior to leaving today. I will notify you of your results once received.    It was a pleasure to see you in the office the other day.

## 2019-11-05 NOTE — Progress Notes (Signed)
Subjective:    Patient ID: Wesley Ward, male    DOB: November 20, 1960, 59 y.o.   MRN: [id number]  HPI  This visit occurred during the SARS-CoV-2 public health emergency.  Safety protocols were in place, including screening questions prior to the visit, additional usage of staff PPE, and extensive cleaning of exam room while observing appropriate contact time as indicated for disinfecting solutions.   Wesley Ward is a 59 year old male is a 59 year old male with a history of hypertension, dental caries, difficulty urinating who presents today for follow up.  Currently managed on amlodipine 10 mg and HCTZ 12.5 mg for hypertension.  He denies chest pain, shortness of breath, dizziness.   BP Readings from Last 3 Encounters:  11/05/19 130/86  07/16/19 124/70  07/22/18 130/82   Doing well on tamsulosin for urinary stream.   Chronic history of dental carries with abscess. Following with [facility] school and will undergo a dental procedure in a few weeks. He endorses an active abscess that will need treatment prior to his procedure. They will not complete the procedure if he has active infection.  Review of Systems  Constitutional: Negative for fever.  HENT:       Dental abscess  Eyes: Negative for visual disturbance.  Respiratory: Negative for shortness of breath.   Cardiovascular: Negative for chest pain.  Neurological: Negative for dizziness.       Past Medical History:  Diagnosis Date   Essential hypertension      Social History   Socioeconomic History   Marital status: Married    Spouse name: Not on file   Number of children: Not on file   Years of education: Not on file   Highest education level: Not on file  Occupational History   Not on file  Tobacco Use   Smoking status: Former Smoker   Smokeless tobacco: Never Used   Tobacco comment: avg 2 cigs/day  Vaping Use   Vaping Use: Never used  Substance and Sexual Activity   Alcohol use: No     Alcohol/week: 0.0 standard drinks   Drug use: No   Sexual activity: Not on file  Other Topics Concern   Not on file  Social History Narrative   Married.   4 children.   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   Enjoys doing comedy.    Social Determinants of Health   Financial Resource Strain:    Difficulty of Paying Living Expenses: Not on file  Food Insecurity:    Worried About Programme researcher, broadcasting/film/video in the Last Year: Not on file   [employer] of Food in the Last Year: Not on file  Transportation Needs:    Lack of Transportation (Medical): Not on file   Lack of Transportation (Non-Medical): Not on file  Physical Activity:    Days of Exercise per Week: Not on file   Minutes of Exercise per Session: Not on file  Stress:    Feeling of Stress : Not on file  Social Connections:    Frequency of Communication with Friends and Family: Not on file   Frequency of Social Gatherings with Friends and Family: Not on file   Attends Religious Services: Not on file   Active Member of Clubs or Organizations: Not on file   Attends Banker Meetings: Not on file   Marital Status: Not on file  Intimate Partner Violence:    Fear of Current or Ex-Partner:  Not on file   Emotionally Abused: Not on file   Physically Abused: Not on file   Sexually Abused: Not on file    Past Surgical History:  Procedure Laterality Date   OTHER SURGICAL HISTORY     testiuclar surgery   SURGERY SCROTAL / TESTICULAR      Family History  Problem Relation Age of Onset   Cancer Father        Unsure   Ovarian cancer Mother    Congestive Heart Failure Brother    Hypertension Brother     Allergies  Allergen Reactions   Ace Inhibitors Swelling    Current Outpatient Medications on File Prior to Visit  Medication Sig Dispense Refill   amLODipine (NORVASC) 10 MG tablet Take 1 tablet by mouth once daily for blood pressure 30 tablet 0   hydrochlorothiazide  (MICROZIDE) 12.5 MG capsule TAKE 1 CAPSULE BY MOUTH ONCE DAILY FOR BLOOD PRESSURE 30 capsule 0   naproxen (NAPROSYN) 500 MG tablet Take 1 tablet (500 mg total) by mouth 2 (two) times daily as needed for moderate pain. With food 30 tablet 0   tamsulosin (FLOMAX) 0.4 MG CAPS capsule TAKE 1 CAPSULE BY MOUTH ONCE DAILY FOR  URINATION 30 capsule 0   No current facility-administered medications on file prior to visit.    BP 130/86    Pulse 88    Temp (!) 96.3 F (35.7 C) (Temporal)    Ht 6\' 2"  (1.88 m)    Wt 229 lb (103.9 kg)    SpO2 98%    BMI 29.40 kg/m    Objective:   Physical Exam HENT:     Mouth/Throat:     Comments: Numerous dental carries, missing teeth, open skin with clear drainage to inner roof of mouth along front two teeth. Cardiovascular:     Rate and Rhythm: Normal rate and regular rhythm.  Pulmonary:     Effort: Pulmonary effort is normal.     Breath sounds: Normal breath sounds.  Musculoskeletal:     Cervical back: Neck supple.  Skin:    General: Skin is warm and dry.            Assessment & Plan:

## 2019-11-05 NOTE — Progress Notes (Signed)
Subjective:    Patient ID: Wesley Ward, male    DOB: August 24, 1960, 59 y.o.   MRN: [id number]  HPI   Wesley Ward is a 59 year old black male with a history of hypertension and difficultly urinating who presents today to follow up.   1. Hypertension which is managed currently with amlodipine 10MG  daily and hydrochlorothiazide 12.5 mg daily. Patient taking these medications daily with no issues.    2. Difficulty urinating currently managed with tamsulosin 0.4mg  once daily. Patient reports no issues with urination since beginning this medication.     Pt reports last week he went to the dental school at [facility] due to poor dental health and pain. Pt reports reoccurrence of dental abscesses Patient states he is going back in September to come up with a plan. Patient is not taking anything for this pain currently. Patient denies any fevers or chills.      BP Readings from Last 3 Encounters:  11/05/19 130/86  07/16/19 124/70  07/22/18 130/82    Review of Systems  Constitutional: Negative.   HENT: Positive for dental problem and mouth sores. Negative for ear discharge, ear pain and facial swelling.   Respiratory: Negative.  Negative for chest tightness and shortness of breath.   Cardiovascular: Negative.  Negative for chest pain and leg swelling.  Gastrointestinal: Negative.   Musculoskeletal: Negative.   Neurological: Negative.  Negative for dizziness and headaches.        Past Medical History:  Diagnosis Date  . Essential hypertension      Social History   Socioeconomic History  . Marital status: Married    Spouse name: Not on file  . Number of children: Not on file  . Years of education: Not on file  . Highest education level: Not on file  Occupational History  . Not on file  Tobacco Use  . Smoking status: Former 09/21/18  . Smokeless tobacco: Never Used  . Tobacco comment: avg 2 cigs/day  Vaping Use  . Vaping Use: Never used  Substance and Sexual Activity  .  Alcohol use: No    Alcohol/week: 0.0 standard drinks  . Drug use: No  . Sexual activity: Not on file  Other Topics Concern  . Not on file  Social History Narrative   Married.   4 children.   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   Enjoys doing comedy.    Social Determinants of Health   Financial Resource Strain:   . Difficulty of Paying Living Expenses: Not on file  Food Insecurity:   . Worried About [place] in the Last Year: Not on file  . Ran Out of Food in the Last Year: Not on file  Transportation Needs:   . Lack of Transportation (Medical): Not on file  . Lack of Transportation (Non-Medical): Not on file  Physical Activity:   . Days of Exercise per Week: Not on file  . Minutes of Exercise per Session: Not on file  Stress:   . Feeling of Stress : Not on file  Social Connections:   . Frequency of Communication with Friends and Family: Not on file  . Frequency of Social Gatherings with Friends and Family: Not on file  . Attends Religious Services: Not on file  . Active Member of Clubs or Organizations: Not on file  . Attends Programme researcher, broadcasting/film/video Meetings: Not on file  . Marital Status: Not on file  Intimate Partner Violence:   .  Fear of Current or Ex-Partner: Not on file  . Emotionally Abused: Not on file  . Physically Abused: Not on file  . Sexually Abused: Not on file    Past Surgical History:  Procedure Laterality Date  . OTHER SURGICAL HISTORY     testiuclar surgery  . SURGERY SCROTAL / TESTICULAR      Family History  Problem Relation Age of Onset  . Cancer Father        Unsure  . Ovarian cancer Mother   . Congestive Heart Failure Brother   . Hypertension Brother     Allergies  Allergen Reactions  . Ace Inhibitors Swelling    Current Outpatient Medications on File Prior to Visit  Medication Sig Dispense Refill  . amLODipine (NORVASC) 10 MG tablet Take 1 tablet by mouth once daily for blood pressure 30 tablet 0  .  baclofen (LIORESAL) 10 MG tablet Take 1 tablet (10 mg total) by mouth 3 (three) times daily. Caution of sedation 30 each 0  . hydrochlorothiazide (MICROZIDE) 12.5 MG capsule TAKE 1 CAPSULE BY MOUTH ONCE DAILY FOR BLOOD PRESSURE 30 capsule 0  . naproxen (NAPROSYN) 500 MG tablet Take 1 tablet (500 mg total) by mouth 2 (two) times daily as needed for moderate pain. With food 30 tablet 0  . tamsulosin (FLOMAX) 0.4 MG CAPS capsule TAKE 1 CAPSULE BY MOUTH ONCE DAILY FOR  URINATION 30 capsule 0  . traZODone (DESYREL) 50 MG tablet Take 0.5-1 tablets (25-50 mg total) by mouth at bedtime as needed for sleep. 30 tablet 0   No current facility-administered medications on file prior to visit.         Objective:   Physical Exam HENT:     Mouth/Throat:     Mouth: No oral lesions.     Dentition: Abnormal dentition. Dental tenderness and dental caries present. No dental abscesses.     Pharynx: Oropharynx is clear.  Cardiovascular:     Rate and Rhythm: Normal rate and regular rhythm.     Pulses: Normal pulses.     Heart sounds: Normal heart sounds.  Pulmonary:     Effort: Pulmonary effort is normal.     Breath sounds: Normal breath sounds.  Musculoskeletal:        General: Normal range of motion.  Skin:    General: Skin is warm and dry.  Neurological:     General: No focal deficit present.     Mental Status: He is alert.  Psychiatric:        Mood and Affect: Mood normal.           Assessment & Plan:

## 2019-11-05 NOTE — Assessment & Plan Note (Addendum)
Well controlled in the office today, continue current regimen. CMP pending.  Agree with assessment and plan. Doreene Nest, NP

## 2019-11-05 NOTE — Assessment & Plan Note (Signed)
Doing well on tamsulosin, continue same. PSA pending.

## 2019-11-05 NOTE — Assessment & Plan Note (Deleted)
Well controlled in the office today, continue current regimen. CMP pending. 

## 2019-11-06 LAB — CBC WITH DIFFERENTIAL/PLATELET
Basophils Absolute: 0.1 10*3/uL (ref 0.0–0.1)
Basophils Relative: 1.4 % (ref 0.0–3.0)
Eosinophils Absolute: 0.1 10*3/uL (ref 0.0–0.7)
Eosinophils Relative: 1.8 % (ref 0.0–5.0)
HCT: 38.1 % — ABNORMAL LOW (ref 39.0–52.0)
Hemoglobin: 12.7 g/dL — ABNORMAL LOW (ref 13.0–17.0)
Lymphocytes Relative: 46 % (ref 12.0–46.0)
Lymphs Abs: 2.8 10*3/uL (ref 0.7–4.0)
MCHC: 33.2 g/dL (ref 30.0–36.0)
MCV: 93.8 fl (ref 78.0–100.0)
Monocytes Absolute: 0.5 10*3/uL (ref 0.1–1.0)
Monocytes Relative: 8.7 % (ref 3.0–12.0)
Neutro Abs: 2.6 10*3/uL (ref 1.4–7.7)
Neutrophils Relative %: 42.1 % — ABNORMAL LOW (ref 43.0–77.0)
Platelets: 256 10*3/uL (ref 150.0–400.0)
RBC: 4.07 Mil/uL — ABNORMAL LOW (ref 4.22–5.81)
RDW: 12.9 % (ref 11.5–15.5)
WBC: 6.2 10*3/uL (ref 4.0–10.5)

## 2019-11-06 LAB — COMPREHENSIVE METABOLIC PANEL
ALT: 16 U/L (ref 0–53)
AST: 20 U/L (ref 0–37)
Albumin: 4.4 g/dL (ref 3.5–5.2)
Alkaline Phosphatase: 70 U/L (ref 39–117)
BUN: 15 mg/dL (ref 6–23)
CO2: 30 mEq/L (ref 19–32)
Calcium: 9.9 mg/dL (ref 8.4–10.5)
Chloride: 103 mEq/L (ref 96–112)
Creatinine, Ser: 1.02 mg/dL (ref 0.40–1.50)
GFR: 90.81 mL/min (ref >60.00)
Glucose, Bld: 81 mg/dL (ref 70–99)
Potassium: 4.2 mEq/L (ref 3.5–5.1)
Sodium: 140 mEq/L (ref 135–145)
Total Bilirubin: 0.6 mg/dL (ref 0.2–1.2)
Total Protein: 7.5 g/dL (ref 6.0–8.3)

## 2019-11-06 LAB — LIPID PANEL
Cholesterol: 183 mg/dL (ref 0–200)
HDL: 47.8 mg/dL (ref >39.00)
LDL Cholesterol: 110 mg/dL — ABNORMAL HIGH (ref 0–99)
NonHDL: 135.18
Total CHOL/HDL Ratio: 4
Triglycerides: 126 mg/dL (ref 0.0–149.0)
VLDL: 25.2 mg/dL (ref 0.0–40.0)

## 2019-11-06 LAB — PSA: PSA: 1.34 ng/mL (ref 0.10–4.00)

## 2019-11-18 ENCOUNTER — Telehealth: Payer: Self-pay

## 2019-11-18 NOTE — Telephone Encounter (Signed)
Left message for patient to call office to discuss lab results and recommendations. 

## 2019-11-18 NOTE — Telephone Encounter (Signed)
-----   Message from Doreene Nest, NP sent at 11/16/2019  6:09 PM EDT ----- Carollee Herter, I received notification that this patient has NOT viewed his my chart message. Do you mind giving him a call?

## 2019-11-20 NOTE — Telephone Encounter (Signed)
Left message with patients wife to have patient call back.

## 2019-11-20 NOTE — Telephone Encounter (Signed)
-----   Message from Katherine K Clark, NP sent at 11/16/2019  6:09 PM EDT ----- Wesley Ward, I received notification that this patient has NOT viewed his my chart message. Do you mind giving him a call? 

## 2019-11-21 ENCOUNTER — Telehealth: Payer: Self-pay

## 2019-11-21 NOTE — Telephone Encounter (Signed)
-----   Message from Katherine K Clark, NP sent at 11/16/2019  6:09 PM EDT ----- Lindora Alviar, I received notification that this patient has NOT viewed his my chart message. Do you mind giving him a call? 

## 2019-11-21 NOTE — Telephone Encounter (Signed)
Left detailed message for patient to call office to discuss need for medication.

## 2019-11-21 NOTE — Telephone Encounter (Signed)
Patient aware of results and recommendations, he wants to try to lower with diet and exercise for now.

## 2019-11-29 ENCOUNTER — Telehealth: Payer: Self-pay | Admitting: Primary Care

## 2019-11-29 DIAGNOSIS — R39198 Other difficulties with micturition: Secondary | ICD-10-CM

## 2019-11-29 DIAGNOSIS — I10 Essential (primary) hypertension: Secondary | ICD-10-CM

## 2019-12-01 NOTE — Telephone Encounter (Signed)
Spouse called checking on rx She stated pt is out of meds

## 2019-12-01 NOTE — Telephone Encounter (Signed)
Called patient let know that refills have ben called in and if any issues let our office know.

## 2019-12-03 ENCOUNTER — Ambulatory Visit: Payer: 59 | Admitting: Primary Care

## 2020-08-21 ENCOUNTER — Other Ambulatory Visit: Payer: Self-pay | Admitting: Primary Care

## 2020-08-21 DIAGNOSIS — I10 Essential (primary) hypertension: Secondary | ICD-10-CM

## 2020-08-22 NOTE — Telephone Encounter (Signed)
Patient is due for CPE/follow up in August 2022, this will be required prior to any further refills.  Please schedule.   

## 2020-08-23 NOTE — Telephone Encounter (Signed)
Called appointment made with patient. No further action at this time.

## 2020-11-02 ENCOUNTER — Encounter: Payer: 59 | Admitting: Primary Care

## 2020-12-02 IMAGING — DX DG CERVICAL SPINE COMPLETE 4+V
6 series · 6 of 6 positions shown · non-contrast
Comparison: None.

CLINICAL DATA: Acute on chronic right-sided neck pain without
radiculopathy

EXAM:
CERVICAL SPINE - COMPLETE 4+ VIEW

[c-spine lat]
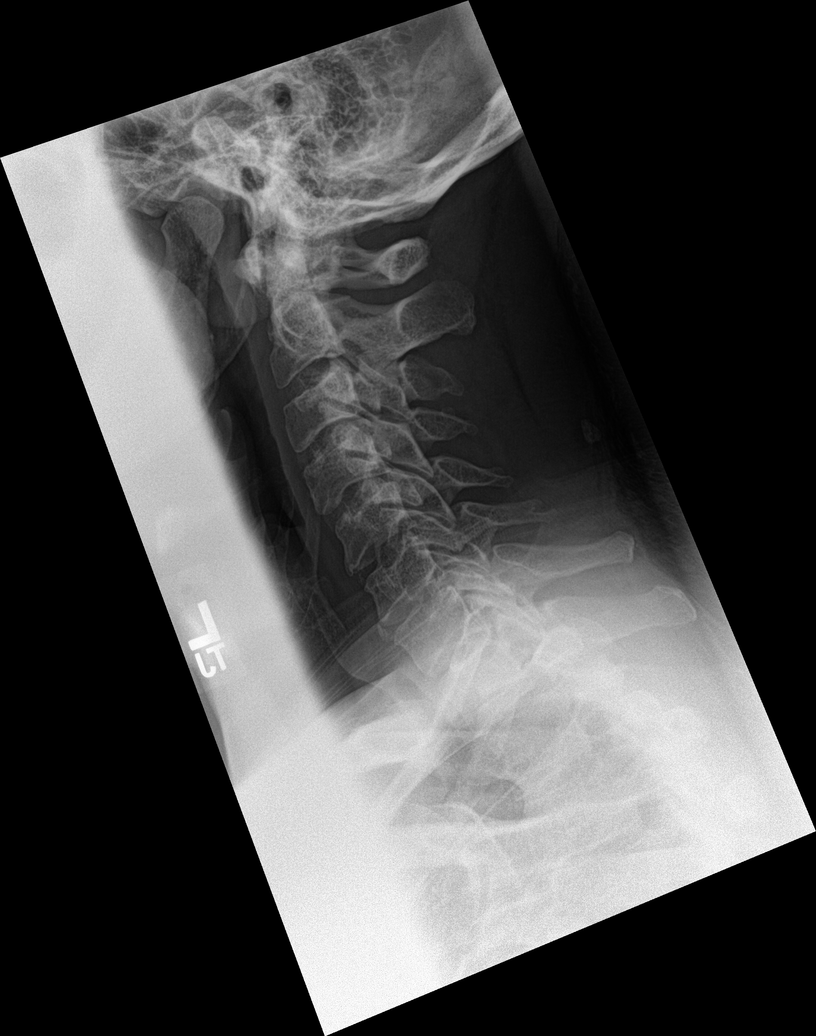

[c-spine obl (1 of 2)]
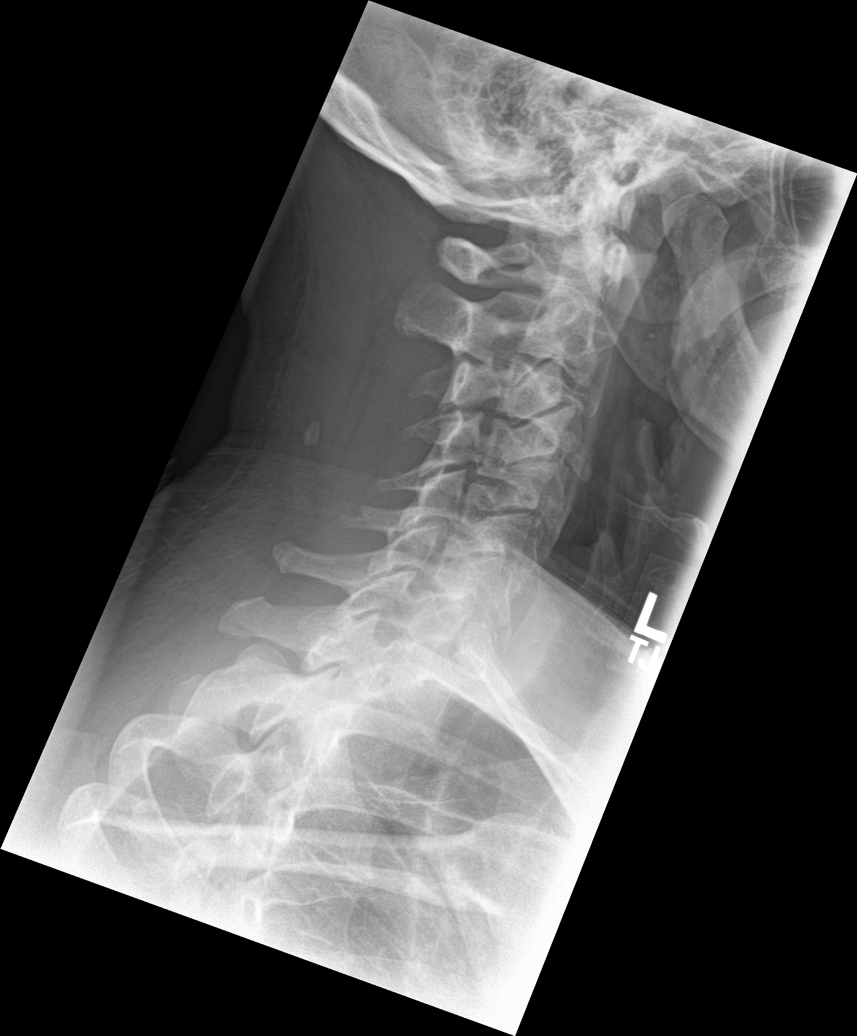

[c-spine obl (2 of 2)]
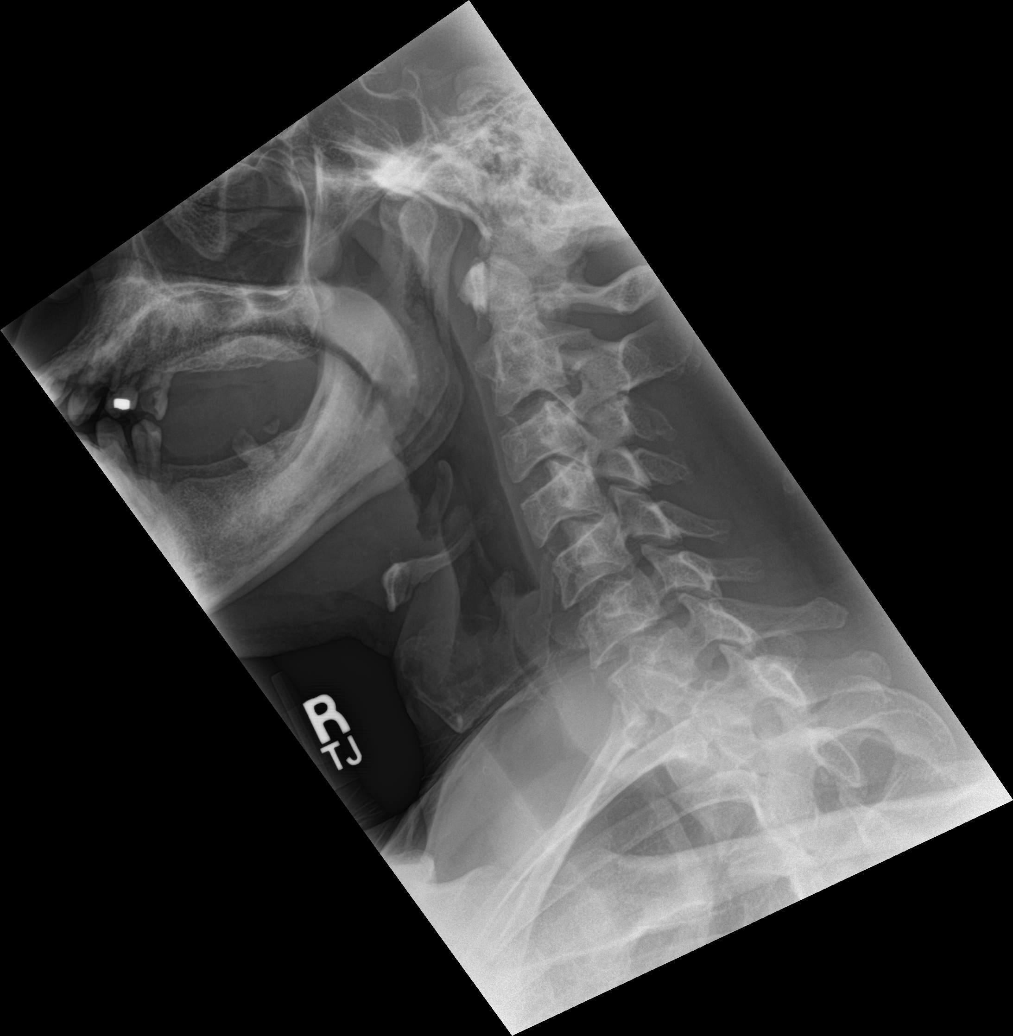

[c-spine ap]
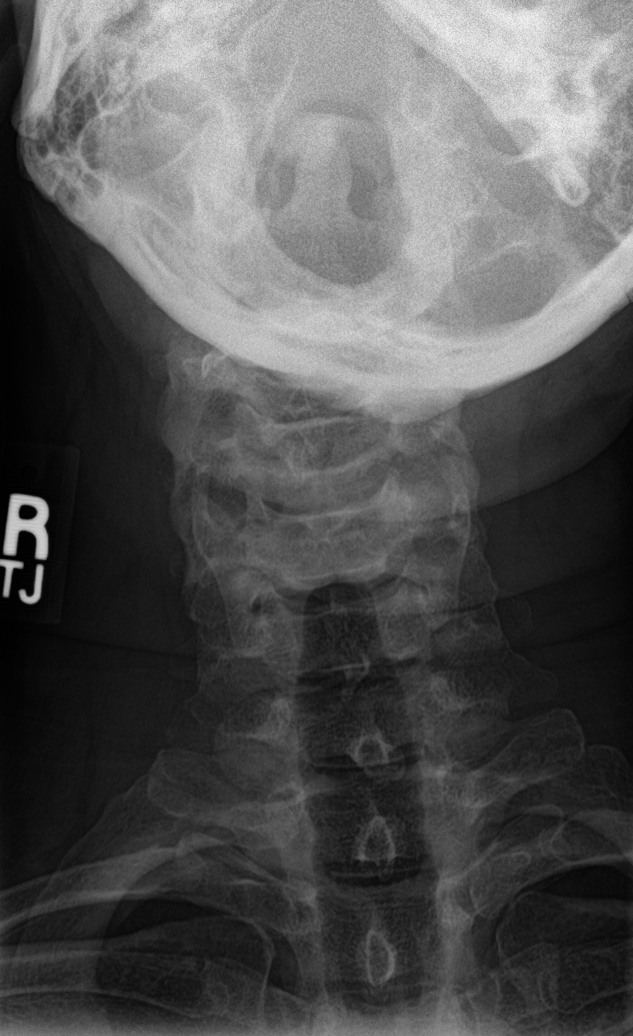

[c-spine open mouth]
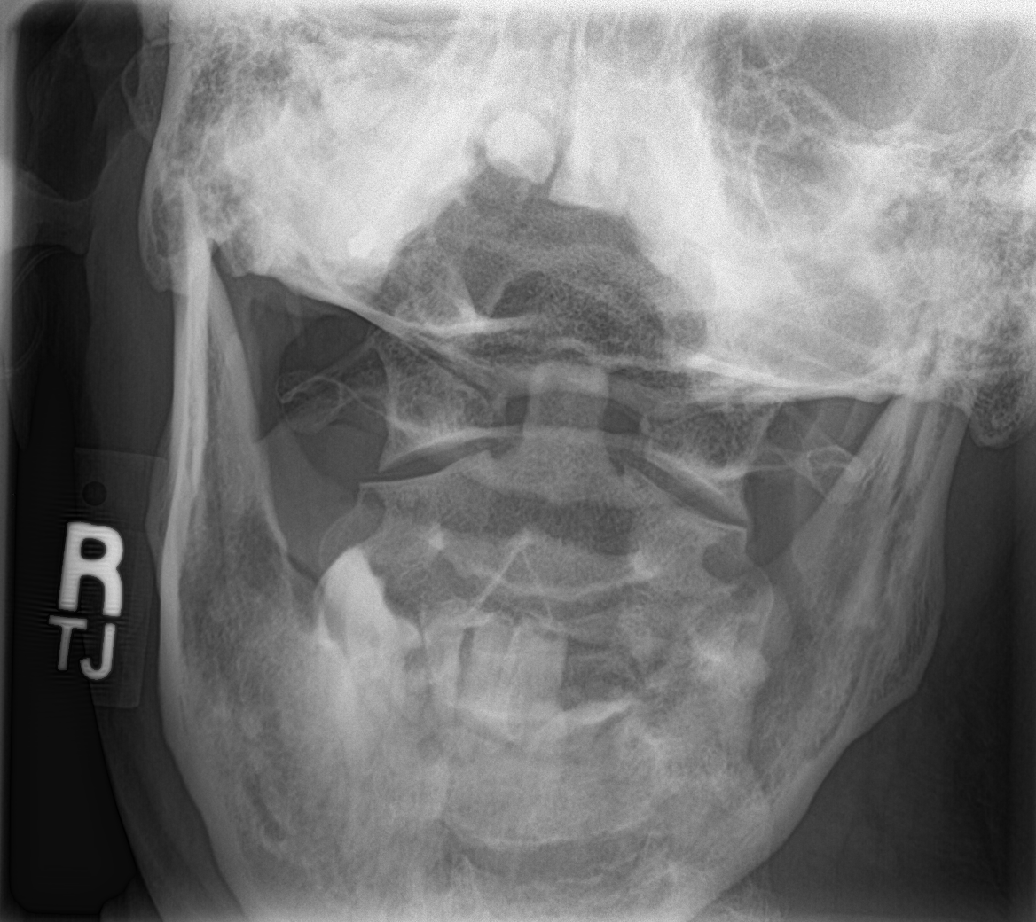

[c-spine swimmers]
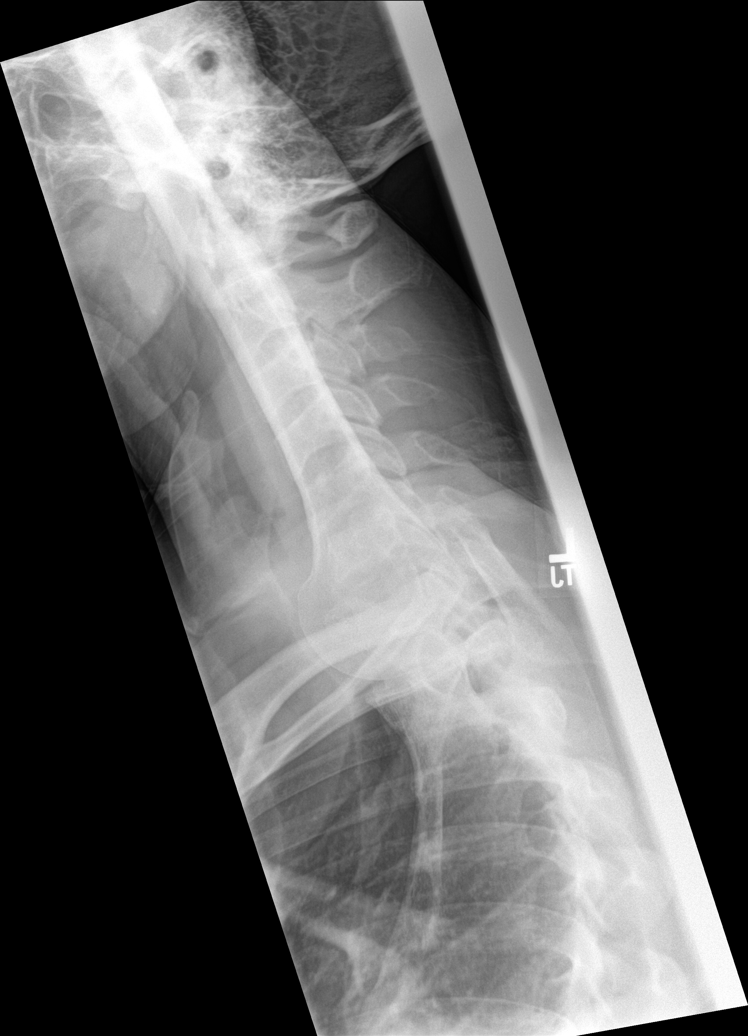

[6 of 6 positions shown; findings below may reference images not displayed]

FINDINGS: Seven cervical levels are visualized on lateral radiograph. No acute
fracture or vertebral body height loss. The dens is intact. No
traumatic listhesis. Minimal intervertebral disc height loss and
cervical spondylitic change with some mild uncinate spurring most
pronounced at the C3-4 level. No suspicious osseous lesion. No pre
or paravertebral soft tissue abnormality. No acute abnormality in
the upper chest or imaged lung apices. Airway is patent.
IMPRESSION: 1. No acute bony abnormality of the cervical spine.
2. Mild multilevel degenerative changes in the cervical spine.

## 2020-12-06 ENCOUNTER — Other Ambulatory Visit: Payer: Self-pay | Admitting: Primary Care

## 2020-12-06 DIAGNOSIS — R39198 Other difficulties with micturition: Secondary | ICD-10-CM
